# Patient Record
Sex: Male | Born: 1935 | Race: White | Hispanic: No | Smoking: Never smoker
Health system: Southern US, Community
[De-identification: ages and names within clinical notes are randomized; demographics above are authoritative.]

## PROBLEM LIST (undated history)

## (undated) DIAGNOSIS — I2510 Atherosclerotic heart disease of native coronary artery without angina pectoris: Secondary | ICD-10-CM

## (undated) DIAGNOSIS — M1990 Unspecified osteoarthritis, unspecified site: Secondary | ICD-10-CM

## (undated) DIAGNOSIS — F329 Major depressive disorder, single episode, unspecified: Secondary | ICD-10-CM

## (undated) DIAGNOSIS — R41841 Cognitive communication deficit: Secondary | ICD-10-CM

## (undated) DIAGNOSIS — E785 Hyperlipidemia, unspecified: Secondary | ICD-10-CM

## (undated) DIAGNOSIS — G4700 Insomnia, unspecified: Secondary | ICD-10-CM

## (undated) DIAGNOSIS — R1310 Dysphagia, unspecified: Secondary | ICD-10-CM

## (undated) DIAGNOSIS — F32A Depression, unspecified: Secondary | ICD-10-CM

## (undated) DIAGNOSIS — F0390 Unspecified dementia without behavioral disturbance: Secondary | ICD-10-CM

## (undated) DIAGNOSIS — R42 Dizziness and giddiness: Secondary | ICD-10-CM

## (undated) DIAGNOSIS — M25569 Pain in unspecified knee: Secondary | ICD-10-CM

## (undated) DIAGNOSIS — K5730 Diverticulosis of large intestine without perforation or abscess without bleeding: Secondary | ICD-10-CM

## (undated) DIAGNOSIS — E7800 Pure hypercholesterolemia, unspecified: Secondary | ICD-10-CM

## (undated) DIAGNOSIS — K5900 Constipation, unspecified: Secondary | ICD-10-CM

## (undated) DIAGNOSIS — I10 Essential (primary) hypertension: Secondary | ICD-10-CM

## (undated) DIAGNOSIS — N400 Enlarged prostate without lower urinary tract symptoms: Secondary | ICD-10-CM

## (undated) DIAGNOSIS — M6281 Muscle weakness (generalized): Secondary | ICD-10-CM

## (undated) DIAGNOSIS — K635 Polyp of colon: Secondary | ICD-10-CM

## (undated) DIAGNOSIS — J189 Pneumonia, unspecified organism: Secondary | ICD-10-CM

## (undated) DIAGNOSIS — I739 Peripheral vascular disease, unspecified: Secondary | ICD-10-CM

## (undated) HISTORY — PX: TONSILLECTOMY: SUR1361

## (undated) HISTORY — PX: HERNIA REPAIR: SHX51

## (undated) HISTORY — PX: APPENDECTOMY: SHX54

## (undated) HISTORY — DX: Polyp of colon: K63.5

## (undated) HISTORY — DX: Dizziness and giddiness: R42

## (undated) HISTORY — DX: Essential (primary) hypertension: I10

## (undated) HISTORY — DX: Hyperlipidemia, unspecified: E78.5

## (undated) HISTORY — PX: ENDARTERECTOMY FEMORAL: SHX5804

## (undated) HISTORY — DX: Diverticulosis of large intestine without perforation or abscess without bleeding: K57.30

## (undated) HISTORY — PX: ANGIOPLASTY: SHX39

## (undated) HISTORY — DX: Atherosclerotic heart disease of native coronary artery without angina pectoris: I25.10

---

## 2000-10-08 ENCOUNTER — Encounter (INDEPENDENT_AMBULATORY_CARE_PROVIDER_SITE_OTHER): Payer: Self-pay | Admitting: Specialist

## 2000-10-08 ENCOUNTER — Ambulatory Visit (HOSPITAL_COMMUNITY): Admission: RE | Admit: 2000-10-08 | Discharge: 2000-10-08 | Payer: Self-pay | Admitting: Gastroenterology

## 2002-04-03 ENCOUNTER — Encounter: Payer: Self-pay | Admitting: Internal Medicine

## 2002-04-03 ENCOUNTER — Inpatient Hospital Stay (HOSPITAL_COMMUNITY): Admission: EM | Admit: 2002-04-03 | Discharge: 2002-04-07 | Payer: Self-pay | Admitting: Internal Medicine

## 2007-04-10 ENCOUNTER — Encounter: Admission: RE | Admit: 2007-04-10 | Discharge: 2007-04-10 | Payer: Self-pay | Admitting: Cardiology

## 2007-04-14 ENCOUNTER — Inpatient Hospital Stay (HOSPITAL_COMMUNITY): Admission: AD | Admit: 2007-04-14 | Discharge: 2007-04-15 | Payer: Self-pay | Admitting: Cardiology

## 2007-04-14 ENCOUNTER — Encounter: Payer: Self-pay | Admitting: Cardiology

## 2007-04-14 ENCOUNTER — Ambulatory Visit: Payer: Self-pay | Admitting: Vascular Surgery

## 2007-07-25 ENCOUNTER — Encounter: Payer: Self-pay | Admitting: Cardiology

## 2007-07-25 ENCOUNTER — Ambulatory Visit: Payer: Self-pay | Admitting: Vascular Surgery

## 2007-08-18 ENCOUNTER — Encounter: Payer: Self-pay | Admitting: Cardiology

## 2007-08-18 ENCOUNTER — Encounter: Payer: Self-pay | Admitting: Vascular Surgery

## 2007-08-18 ENCOUNTER — Inpatient Hospital Stay (HOSPITAL_COMMUNITY): Admission: RE | Admit: 2007-08-18 | Discharge: 2007-08-19 | Payer: Self-pay | Admitting: Vascular Surgery

## 2007-08-18 ENCOUNTER — Ambulatory Visit: Payer: Self-pay | Admitting: Vascular Surgery

## 2007-09-12 ENCOUNTER — Ambulatory Visit: Payer: Self-pay | Admitting: Vascular Surgery

## 2007-09-12 ENCOUNTER — Encounter: Payer: Self-pay | Admitting: Cardiology

## 2008-12-07 IMAGING — CR DG CHEST 2V
2 series · 2 of 2 positions shown · non-contrast
Comparison: 04/10/2007.

CLINICAL DATA: 84-year-old male preoperative exam, peripheral
vascular disease.

CHEST - 2 VIEW

[view not recorded (1 of 2)]
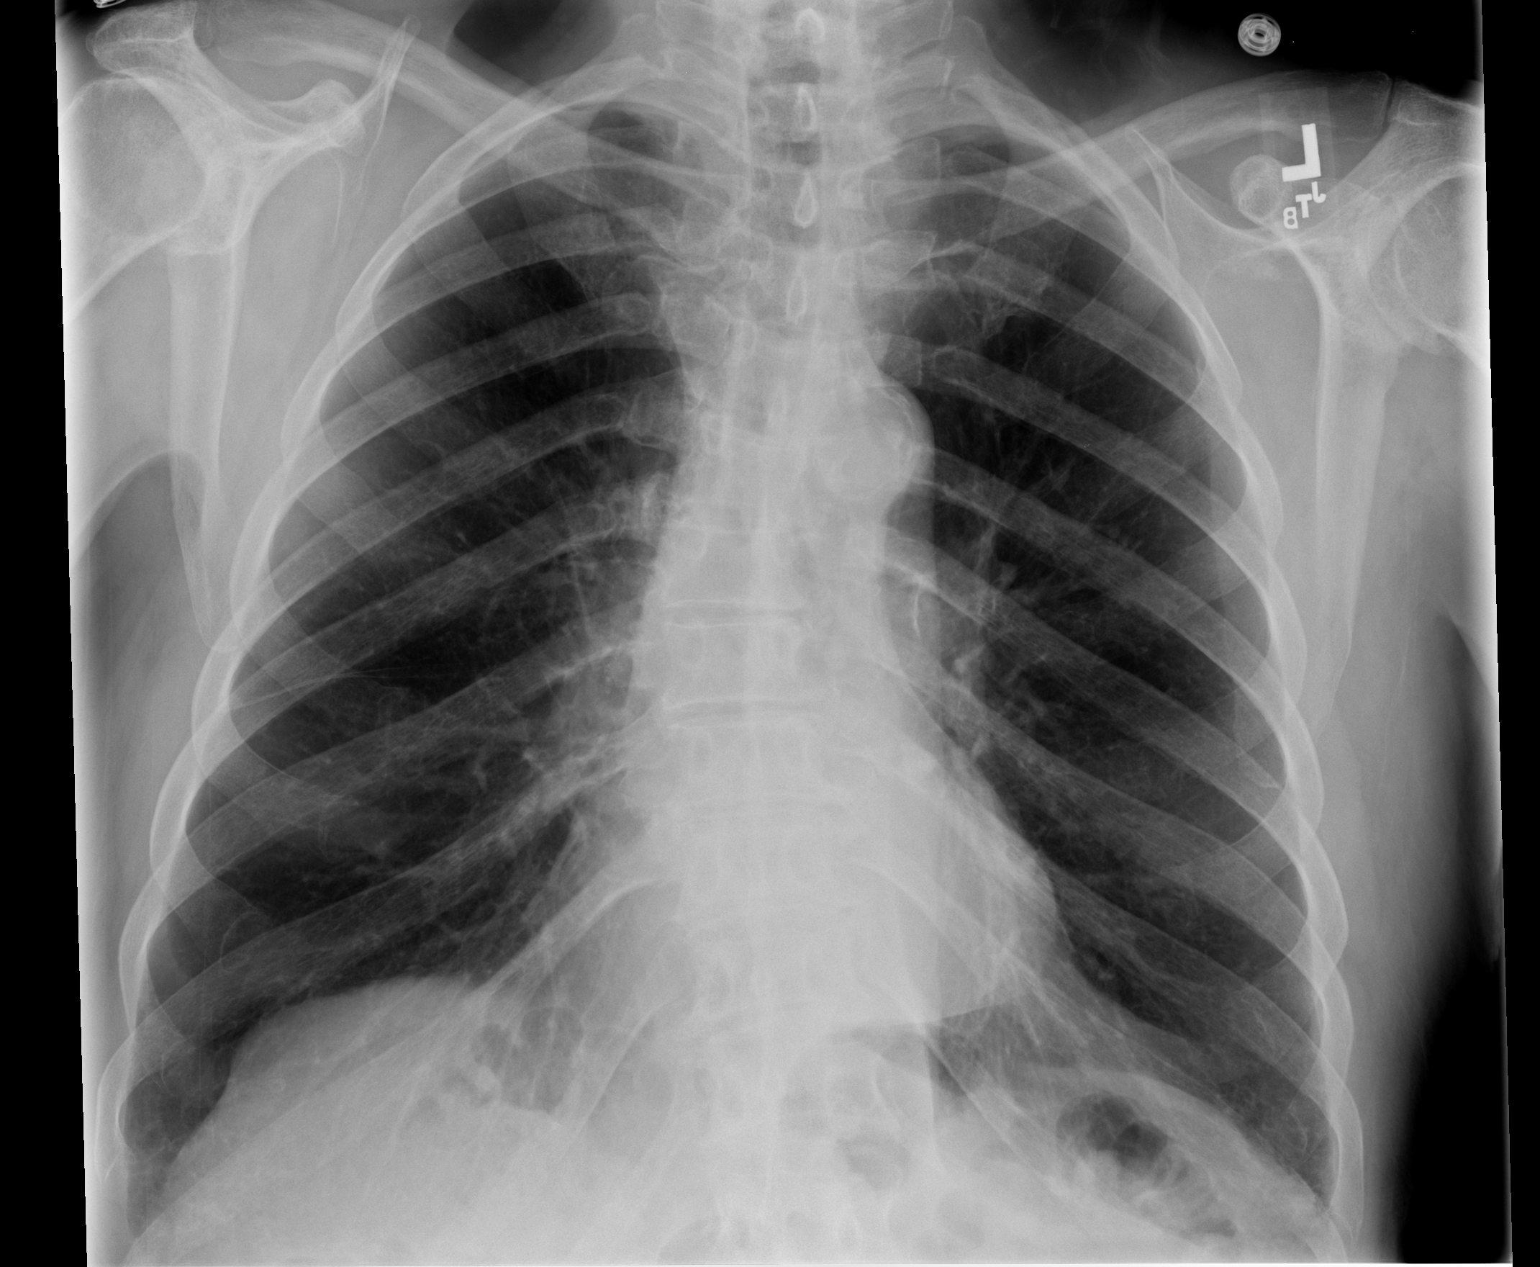

[view not recorded (2 of 2)]
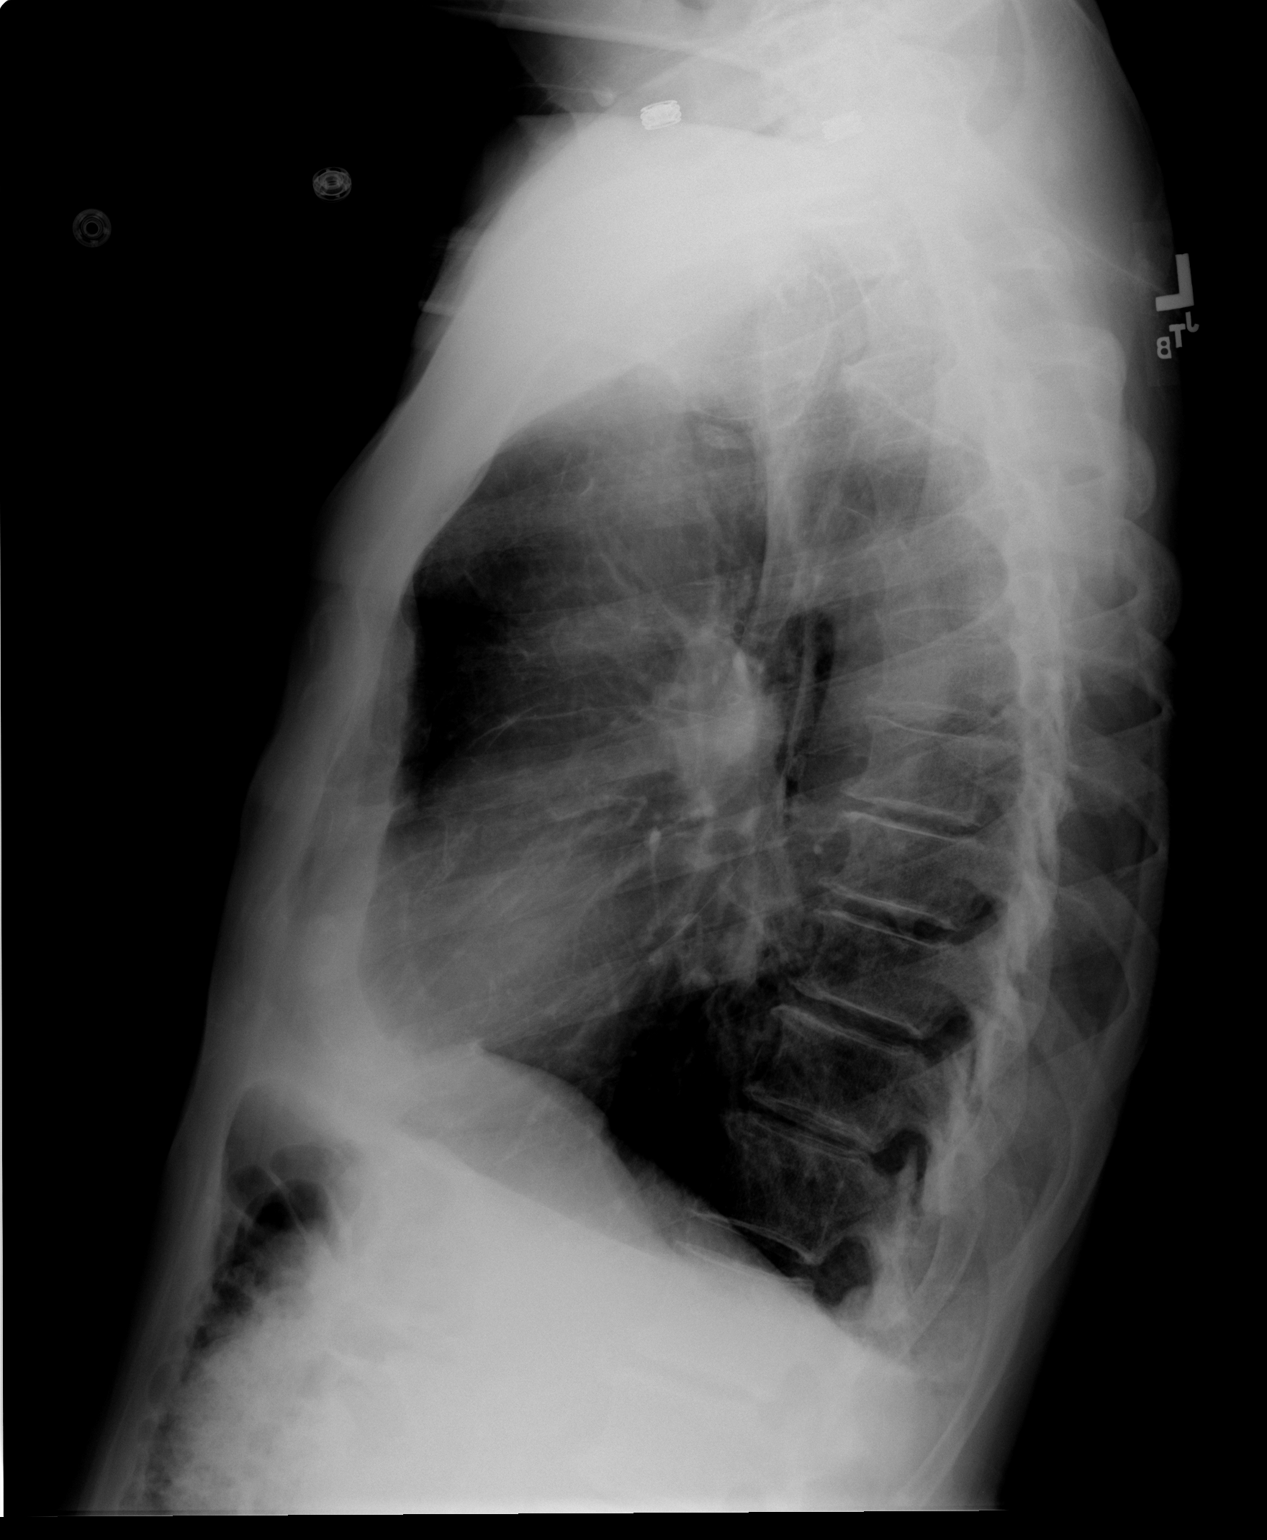

[2 of 2 positions shown; findings below may reference images not displayed]

FINDINGS: Cardiac size is stable and within normal limits.
Mediastinal contour is stable within normal limits.  Large lung
volumes are re-identified with mild basilar and perihilar linear
markings which probably reflects scarring.  No pneumothorax,
pulmonary edema, pleural effusion or acute airspace opacity.
Stable visualized osseous structures.
IMPRESSION: No acute cardiopulmonary abnormality.

## 2009-11-14 ENCOUNTER — Ambulatory Visit: Payer: Self-pay | Admitting: Cardiology

## 2009-11-14 DIAGNOSIS — I739 Peripheral vascular disease, unspecified: Secondary | ICD-10-CM

## 2009-11-14 DIAGNOSIS — I2510 Atherosclerotic heart disease of native coronary artery without angina pectoris: Secondary | ICD-10-CM | POA: Insufficient documentation

## 2009-11-14 DIAGNOSIS — I10 Essential (primary) hypertension: Secondary | ICD-10-CM

## 2009-11-14 DIAGNOSIS — E7800 Pure hypercholesterolemia, unspecified: Secondary | ICD-10-CM

## 2009-11-18 ENCOUNTER — Encounter: Payer: Self-pay | Admitting: Cardiology

## 2009-11-28 ENCOUNTER — Telehealth: Payer: Self-pay | Admitting: Cardiology

## 2009-11-29 ENCOUNTER — Ambulatory Visit: Payer: Self-pay

## 2009-11-29 ENCOUNTER — Encounter: Payer: Self-pay | Admitting: Cardiology

## 2009-12-08 ENCOUNTER — Telehealth: Payer: Self-pay | Admitting: Cardiology

## 2009-12-16 ENCOUNTER — Ambulatory Visit: Payer: Self-pay

## 2009-12-19 ENCOUNTER — Ambulatory Visit (HOSPITAL_COMMUNITY): Admission: RE | Admit: 2009-12-19 | Discharge: 2009-12-19 | Payer: Self-pay | Admitting: Obstetrics and Gynecology

## 2009-12-19 ENCOUNTER — Ambulatory Visit: Payer: Self-pay | Admitting: Cardiology

## 2009-12-19 ENCOUNTER — Encounter: Payer: Self-pay | Admitting: Cardiology

## 2010-04-04 NOTE — Assessment & Plan Note (Signed)
Summary: np6/cardiac eval/jml   Primary Provider:  Dr. Christell Constant  CC:  new patient.  cardiac evaluation. Pt states he feels good.  He is having no complications.  .  History of Present Illness: 77 yo with history of CAD and PAD presents to establish cardiology care.  He had NSTEMI in 2004 with Cypher DES to RCA, and he had right common femoral arterectomy by Dr. Arbie Cookey in 2009.  He has been doing well over the past couple of years.  He has not had any exertional chest pain.  He does not get short of breath with exertion, either.  He is quite active.  He walks and push mows his yard.  BP is elevated today at 176/84.  He should be taking lisinopril 40 mg daily but only remembers about twice a week.  Last time he took it was Saturday.  He lives at home by himself (widower).  He denies any claudication-type symptoms.    ECG: NSR, normal  Current Medications (verified): 1)  Plavix 75 Mg Tabs (Clopidogrel Bisulfate) .... One Tablet Once Daily 2)  Aspirin 81 Mg Tabs (Aspirin) .... Two Tablets Every Morning 3)  Lisinopril 40 Mg Tabs (Lisinopril) .... Take One Tablet Twice A Week For Hbp.  Allergies (verified): 1)  ! Pcn  Past History:  Past Medical History: 1. CAD: NSTEMI 2/04 with occluded RCA.  Cypher DES to RCA.  EF 45-50% on LV-gram.  2. PAD: Right CFA endarterectomy (Early) in 6/09.  3. HTN 4. Hyperlipidemia  Family History: No premature CAD.   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  He quit smoking 35 years ago Does not drink alcohol 2 sons Lives in [place].     Review of Systems       All systems reviewed and negative except as per HPI.   Vital Signs:  Patient profile:   77 year old male Height:      67 inches Weight:      133 pounds BMI:     20.91 Pulse rate:   92 / minute Pulse rhythm:   regular BP sitting:   176 / 84  (left arm) Cuff size:   regular  Vitals Entered By: Judithe Modest CMA (November 14, 2009  9:21 AM)  Physical Exam  General:  Well developed, well nourished, in no acute distress. Head:  normocephalic and atraumatic Nose:  no deformity, discharge, inflammation, or lesions Mouth:  Teeth, gums and palate normal. Oral mucosa normal. Neck:  Neck supple, no JVD. No masses, thyromegaly or abnormal cervical nodes. Lungs:  Clear bilaterally to auscultation and percussion. Heart:  Non-displaced PMI, chest non-tender; regular rate and rhythm, S1, S2 without murmurs, rubs. +S4. Carotid upstroke normal, no bruit. 1+ PT bilaterally. No edema, no varicosities. Abdomen:  Bowel sounds positive; abdomen soft and non-tender without masses, organomegaly, or hernias noted. No hepatosplenomegaly. Msk:  Back normal, normal gait. Muscle strength and tone normal. Extremities:  No clubbing or cyanosis. Neurologic:  Alert and oriented x 3. Skin:  Intact without lesions or rashes. Psych:  Normal affect.   Impression & Recommendations:  Problem # 1:  CORONARY ATHEROSCLEROSIS NATIVE CORONARY ARTERY (ICD-414.01) No ischemic symptoms.  He is not taking a statin and really ought to be.  I am going to start him on simvastatin.  I will get lipids/LFTs this week to decide on dosing and repeat lipids/LFTs in 2 months with goal LDL < 70.  He needs to continue ASA  and Plavix.  Given mildly decreased LV systolic function on LV-gram in 2004, I will set him up for an echo to assess LV systolic function.   Problem # 2:  UNSPECIFIED ESSENTIAL HYPERTENSION (ICD-401.9) BP is high but patient rarely takes his lisinopril.  I asked him to take it every day.  We will call him in a couple of weeks for a BP check on medication (he will get him BP checked at the pharmacy).    Problem # 3:  PVD (ICD-443.9) No claudication.  Will get lower extremity arterial dopplers to followup on PAD.   Other Orders: Echocardiogram (Echo) Arterial Duplex Lower Extremity (Arterial Duplex Low)  Patient Instructions: 1)  Your physician has  recommended you make the following change in your medication:  2)  Take Lisinopril 40mg  daily 3)  Start Simvastatin 20mg   in the evening--this is for your cholesterol. 4)  Take and record your blood pressure--I will call you in 2 weeks to get the readings. Luana Shu [phone]  5)  Your physician has requested that you have a lower extremity arterial duplex.  This test is an ultrasound of the arteries in the legs.  It looks at arterial blood flow in the legs and arms.  Allow one hour for Lower Arterial scans. There are no restrictions or special instructions. 6)  Your physician has requested that you have an echocardiogram.  Echocardiography is a painless test that uses sound waves to create images of your heart. It provides your doctor with information about the size and shape of your heart and how well your heart's chambers and valves are working.  This procedure takes approximately one hour. There are no restrictions for this procedure. 7)  Fasting lab this week--Lipid profile/Liver profile/BMP this week--you have the order.--You can have this at Dr [facility] office. 8)  Your physician recommends that you return for a FASTING lipid profile/liver profile in 2 months--you have the order. You can have this at [facility] office. 9)  Your physician wants you to follow-up in:  6 months with Dr Shirlee Latch. You will receive a reminder letter in the mail two months in advance. If you don't receive a letter, please call our office to schedule the follow-up appointment. Prescriptions: SIMVASTATIN 20 MG TABS (SIMVASTATIN) one in the evening  #30 x 4   Entered by:   Katina Dung, RN, BSN   Authorized by:   Marca Ancona, MD   Signed by:   Katina Dung, RN, BSN on 11/14/2009   Method used:   Faxed to ...       [facility] (retail)       [address]       [place], [place]  [address]       Ph: [phone] or [phone]       Fax: [phone]   RxID:    [id number] LISINOPRIL 40 MG TABS (LISINOPRIL) take one tablet daily  #30 x 11   Entered by:   Katina Dung, RN, BSN   Authorized by:   Marca Ancona, MD   Signed by:   Katina Dung, RN, BSN on 11/14/2009   Method used:   Faxed to ...       Hospital doctor (retail)       [address]       [place], [place]  [address]       Ph:  [phone] or [phone]       Fax: [phone]   RxID:   [id number]

## 2010-04-04 NOTE — Letter (Signed)
[facility]   Imported By: Roderic Ovens 11/28/2009 10:53:30  _____________________________________________________________________  External Attachment:    Type:   Image     Comment:   External Document

## 2010-04-04 NOTE — Op Note (Signed)
[facility]   Imported By: Roderic Ovens 11/28/2009 10:56:31  _____________________________________________________________________  External Attachment:    Type:   Image     Comment:   External Document

## 2010-04-04 NOTE — Consult Note (Signed)
[facility]   Imported By: Roderic Ovens 11/28/2009 10:59:56  _____________________________________________________________________  External Attachment:    Type:   Image     Comment:   External Document

## 2010-04-04 NOTE — Letter (Signed)
Summary: Generic Letter  Architectural technologist, Main Office  [address]   [place], [place] [address]   Phone: [phone]  Fax: [phone]    11/14/2009  Frank Fleming [address] [place], [place]  [address] DOB June 15, 1923    Fasting Lipid Profile/Liver profile in 2 months--414.01 272.0 401.9 Please fax results to [phone] Dr Jen Mow [name],MD

## 2010-04-04 NOTE — Progress Notes (Signed)
Summary: B/P readings--11/28/09   Phone Note Outgoing Call   Call placed by: Katina Dung, RN, BSN,  November 28, 2009 9:01 AM Call placed to: Patient Summary of Call: call and gt B/P readings  Follow-up for Phone Call        11/14/2009 take Lisinopril 40mg  daily regularly--call and get B/P readings --[facility] Katina Dung, RN, BSN  November 28, 2009 9:46 AM ---I talked with pt-he states he has not been checking his B/P but has been going to Dr [facility] office and getting his B/P checked there-he has not keep a record of the readings--he states Dr Christell Constant had given him a new prescription for his B/P recently because it had been elevated --He went for B/P check yesterday  at Dr Kathi Der and was told his B/P was alot better and that it looked good--I will forward to Dr Shirlee Latch for review

## 2010-04-04 NOTE — Letter (Signed)
Summary: [facility] Office Visit Note   [facility] Office Visit Note   Imported By: Roderic Ovens 11/28/2009 11:03:59  _____________________________________________________________________  External Attachment:    Type:   Image     Comment:   External Document

## 2010-04-04 NOTE — Progress Notes (Signed)
Summary: TEST RESULTS  Phone Note Call from Patient Call back at Home Phone [phone]   Caller: Patient Reason for Call: Talk to Nurse, Lab or Test Results Initial call taken by: Lorne Skeens,  December 08, 2009 10:04 AM  Follow-up for Phone Call        [facility] Katina Dung, RN, BSN  December 08, 2009 11:10 AM --talked with pt --echo rescheduled tp 12/19/09

## 2010-04-04 NOTE — Letter (Signed)
Summary: Generic Letter  Architectural technologist, Main Office  [address]   [place], [place] [address]   Phone: [phone]  Fax: [phone]    11/14/2009  Frank Fleming [address] [place], [place]  [address] DOB 1923-06-07   Fasting lipid profile/liver profile/BMP  414.01 401.9 272.0  This week Please fax results to [phone] Dr Juluis Rainier, MD

## 2010-05-15 ENCOUNTER — Encounter: Payer: Self-pay | Admitting: Cardiology

## 2010-05-15 ENCOUNTER — Ambulatory Visit (INDEPENDENT_AMBULATORY_CARE_PROVIDER_SITE_OTHER): Payer: Medicare Other | Admitting: Cardiology

## 2010-05-15 DIAGNOSIS — I2510 Atherosclerotic heart disease of native coronary artery without angina pectoris: Secondary | ICD-10-CM

## 2010-05-15 DIAGNOSIS — I10 Essential (primary) hypertension: Secondary | ICD-10-CM

## 2010-05-23 NOTE — Assessment & Plan Note (Signed)
Summary: F6M PER LAST VISIT IN SEPT/LG   Primary Provider:  Dr. Christell Constant  CC:  rov.  Marland Kitchen  History of Present Illness: 77 yo with history of CAD and PAD presents for cardiology followup.  He had NSTEMI in 2004 with Cypher DES to RCA, and he had right common femoral arterectomy by Dr. Arbie Cookey in 2009.  Recent echo showed preserved LF systolic function.   He has been doing well over the past couple of years.  He has not had any exertional chest pain.  He does not get short of breath with exertion, either.  He is quite active.  He walks and push mows his yard.  He walks about a mile a day. BP is better controlled with current regimen.  He denies any claudication-type symptoms.    ECG: NSR, normal  Labs (9/11): LDL-P 1347, LDL 94, HDL 60, K 4.9, creatinine 1.61  Current Medications (verified): 1)  Plavix 75 Mg Tabs (Clopidogrel Bisulfate) .... One Tablet Once Daily 2)  Aspirin 81 Mg Tabs (Aspirin) .... Two Tablets Every Morning 3)  Lisinopril-Hydrochlorothiazide 20-12.5 Mg Tabs (Lisinopril-Hydrochlorothiazide) .... Two Daily 4)  Norvasc 5 Mg Tabs (Amlodipine Besylate) .... One Daily  Allergies (verified): 1)  ! Pcn  Past History:  Past Medical History: 1. CAD: NSTEMI 2/04 with occluded RCA.  Cypher DES to RCA.  EF 45-50% on LV-gram.  Echo (10/11) with EF 65-70%, grade I diastolic dysfunction, no significant valvular disease.  2. PAD: Right CFA endarterectomy (Early) in 6/09.  ABIs (10/11) showed mild disease with 0.82 on right and 0.83 on left.  3. HTN 4. Hyperlipidemia  Family History: Reviewed history from 11/14/2009 and no changes required. No premature CAD.   Social History: Reviewed history from 11/14/2009 and no changes required. 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  He quit smoking 35 years ago Does not drink alcohol 2 sons Lives in [place].     Review of Systems       All systems reviewed and negative except as per HPI.   Vital  Signs:  Patient profile:   77 year old male Height:      67 inches Weight:      133 pounds BMI:     20.91 Pulse rate:   76 / minute Pulse rhythm:   regular BP sitting:   136 / 66  (left arm) Cuff size:   regular  Vitals Entered By: Judithe Modest CMA (May 15, 2010 3:46 PM)  Physical Exam  General:  Well developed, well nourished, in no acute distress. Neck:  Neck supple, no JVD. No masses, thyromegaly or abnormal cervical nodes. Lungs:  Clear bilaterally to auscultation and percussion. Heart:  Non-displaced PMI, chest non-tender; regular rate and rhythm, S1, S2 without murmurs, rubs. +S4. Carotid upstroke normal, no bruit. 1+ PT bilaterally. No edema, no varicosities. Abdomen:  Bowel sounds positive; abdomen soft and non-tender without masses, organomegaly, or hernias noted. No hepatosplenomegaly. Extremities:  No clubbing or cyanosis. Neurologic:  Alert and oriented x 3. Psych:  Normal affect.   Impression & Recommendations:  Problem # 1:  CORONARY ATHEROSCLEROSIS NATIVE CORONARY ARTERY (ICD-414.01) No ischemic symptoms.  EF preserved on echo.  Continue ASA, Plavix, ACEI.   As below, will add statin.   Problem # 2:  HYPERCHOLESTEROLEMIA (ICD-272.0) Goal LDL < 70, start simvastatin 20 mg daily.   Lipids/LFTs in 2 months.   Problem # 3:  UNSPECIFIED ESSENTIAL HYPERTENSION (ICD-401.9) BP under better control. Continue  current meds.   Problem # 4:  PVD (ICD-443.9) Mild PAD by ABIs.   Patient Instructions: 1)  Your physician has recommended you make the following change in your medication:  2)  Start Simvastatin 20mg  daily in the evening.  3)  Fasting Lipid/liver profile in 2 months  401.9  414.01--you have the order--please fax the results to [phone] 4)  Your physician wants you to follow-up in:1year with Dr Shirlee Latch.[facility] 2013)   You will receive a reminder letter in the mail two months in advance. If you don't receive a letter, please call our office to schedule the  follow-up appointment. Prescriptions: SIMVASTATIN 20 MG TABS (SIMVASTATIN) one daily in the evening  #30 x 6   Entered by:   Katina Dung, RN, BSN   Authorized by:   Marca Ancona, MD   Signed by:   Katina Dung, RN, BSN on 05/15/2010   Method used:   Print then Give to Patient   RxID:   [id number]

## 2010-07-18 NOTE — Cardiovascular Report (Signed)
Frank Fleming, Frank Fleming              ACCOUNT NO.:  [id number]   MEDICAL RECORD NO.:  [id number]          PATIENT TYPE:  AMB   LOCATION:  DFTL                         FACILITY:  [facility]   PHYSICIAN:  Vonna Kotyk R. Jacinto Halim, MD       DATE OF BIRTH:  [birth year]   DATE OF PROCEDURE:  04/14/2007  DATE OF DISCHARGE:                            CARDIAC CATHETERIZATION   PROCEDURES PERFORMED:  1. Abdominal aortogram.  2. Pelvic arteriogram.  3. Abdominal aortogram with bifemoral runoff.  4. Right femoral arteriogram.   INDICATIONS:  Frank Fleming is an 83-year gentleman with history of  known coronary artery disease and has undergone angioplasty and stenting  to the occluded right coronary artery when he presented with unstable  angina and non-Q-wave myocardial infarction in 2004.  He had known  peripheral arterial disease with a known high-grade stenosis of the  right common femoral artery by Dopplers and was being followed  medically.  He has had increasing claudication, but now he was amenable  for angiography, and he was brought to the catheterization lab to  evaluate his peripheral anatomy.  His ABI on the right was 0.82 on the  left was 0.97.  He had common femoral artery velocity of 621 cm/sec.   ANGIOGRAPHIC DATA:  Abdominal aortogram revealed presence of two renal  arteries on either side.  They are widely patent.  The abdominal aorta  showed diffuse luminal irregularity and moderate to severe amount of  calcification of the abdominal aorta, giving a porcine aorta.  The  distal abdominal aorta had mild atherosclerotic changes without any  significant calcium.  There was acute angle takeoff of the iliac  arteries.   Abdominal aortogram with bifemoral runoff.  Abdominal aortogram with  bifemoral runoff revealed a right external iliac arteries to have a 30-  40% calcific stenosis.  There was a high-grade focal 95% stenosis of the  right common femoral artery.  There was diffuse disease of  the mild  diffuse distal right superficial femoral artery followed by 30% stenosis  of the right popliteal artery.   Below the right knee, there was one-vessel runoff in the form of  peroneal artery.  The anterior and post tibial artery were occluded.   On the left lower extremity, the iliac arteries were patent and the left  common femoral artery showed a 40% stenosis.  Midsegment of the left SFA  showed a 70-80% stenosis.  Followed by this, the left popliteal artery  showed a 30% stenosis.   Below the left knee, there was two-vessel runoff noted in the form of  posterior tibial and peroneal arteries.  The anterior tibial artery was  occluded in the mid segment.   RECOMMENDATIONS:  Based on the angiographic data, the patient is not  amenable for a percutaneous revascularization given the fact that the  stenosis is at this hip joint.  I discussed the findings with Dr. Tawanna Cooler  Early who will be operating him in the form of right common femoral  endarterectomy as an outpatient procedure.  The patient admitted to the  hospital for overnight observation and  will be discharged home in the  morning.   Total of 130 mL of contrast was utilized for diagnostic angiography.   TECHNIQUE OF THE PROCEDURE:  Under usual sterile precautions using a 5-  French left femoral arterial access, a 5-French pigtail, the catheter  was advanced to abdominal aortogram and abdominal aortogram was  performed.  The same catheter was utilized to perform abdominal  aortogram with bifemoral runoff.  Using the same catheter to cross over  into the right common iliac artery and using a guidewire for the cross  over, a 5-French end-hole catheter was advanced to the right common  iliac artery and right common iliac arteriogram where visualization of  the femoral artery was performed.  Then the catheter was pulled out of  body.  The patient of procedure.  No immediate complication noted.      Cristy Hilts. Jacinto Halim, MD   Electronically Signed     JRG/MEDQ  D:  04/14/2007  T:  04/15/2007  Job:  [id number]   cc:   [name], M.D.  Ernestina Penna, M.D.

## 2010-07-18 NOTE — Op Note (Signed)
Frank Fleming, BRAILSFORD NO.:  [id number]   MEDICAL RECORD NO.:  [id number]          PATIENT TYPE:  INP   LOCATION:  2007                         FACILITY:  [facility]   PHYSICIAN:  [name], M.D.    DATE OF BIRTH:  1924/03/05   DATE OF PROCEDURE:  DATE OF DISCHARGE:                               OPERATIVE REPORT   PREOPERATIVE DIAGNOSIS:  Right leg claudication.   POSTOPERATIVE DIAGNOSIS:  Right leg claudication.   PROCEDURE:  Right femoral endarterectomy and Dacron patch angioplasty.   SURGEON:  [name], MD   ASSISTANT:  Nurse.   ANESTHESIA:  General endotracheal.   COMPLICATIONS:  None.   DISPOSITION:  To recovery room, stable.   PROCEDURE IN DETAIL:  The patient was taken to the operating room and  placed in supine position.  The area of the right groin was prepped and  draped in usual sterile fashion.  Incision was made over the femoral  pulse, carried down to the isolated common, superficial femoral and  profunda femoris arteries.  These were all encircled with vessel loops  for control.  Tributary branches were occluded with a 2-0 silk Potts  ties.  The patient was given 7000 units of intravenous heparin.  After  adequate circulation time, the distal sternal iliac artery was occluded  under the inguinal ligament with a Henley clamp.  The profunda femoris  artery was occluded with a vascular clamp and the superficial femoral  artery was occluded with a vessel loop.  The common femoral artery was  opened above the level of the plaque and was extended down onto the  proximal portion of the superficial femoral artery.  The patient did  have a tight stenosis at the common femoral artery.  The endarterectomy  was  begun on the common femoral artery and the plaque was divided  proximally with Potts scissors.  Dissection was turned down and the  profunda femoris artery was endarterectomized with eversion technique  with a good feathering end point.   The superficial femoral artery plaque  was divided with Potts scissors.  The distal superficial femoral artery  plaque was tacked with interrupted 6-0 Prolene sutures.  A finesse  Hemashield Dacron patch was brought onto the field and sewn as a patch  angioplasty with a running 6-0 Prolene suture.  Prior to completion of  the anastomosis, the usual flushing maneuvers were undertaken and the  anastomosis was then completed.  Flow was restored first to the profunda  and then the superficial femoral artery.  Excellent flow characteristics  were noted with hand-held Dopplers in the profunda femoris artery and  superficial femoral artery.  The patient was given 50 mg of protamine to  reverse heparin.  The  wounds were irrigated with saline.  Hemostasis was obtained with  electrocautery.  Wounds were closed with several layers of 2-0 Vicryl in  the subcutaneous tissue.  The skin was closed with 3-0 subcuticular  Vicryl stitch.  Sterile dressing was applied and the patient was taken  to the recovery room in stable condition.      Tawanna Cooler  Shirline Frees, M.D.  Electronically Signed     TFE/MEDQ  D:  08/18/2007  T:  08/19/2007  Job:  [id number]   cc:   Cristy Hilts. Jacinto Halim, MD  Gaynelle Cage, MD

## 2010-07-18 NOTE — Assessment & Plan Note (Signed)
OFFICE VISIT   Frank, Fleming  DOB:  Mar 20, 1923                                       09/12/2007  ZOXWR#:[id number]   Patient presents today for followup of his right femoral endarterectomy  and Dacron patch angioplasty on 08/18/07.  He did well and was  discharged home on postoperative day #1.  He reports that he is walking  without claudication symptoms.  He has mild incisional soreness in his  right groin, and this is recovering.   On physical exam, his femoral pulse is 3+.  His wound is well healed.  He has 2+ popliteal pulses bilaterally.  Ankle-arm index is stable at  0.83 on the right and 0.90 on the left.   I am quite pleased with his initial result, as if the patient.  He will  see [place] again on an as-needed basis.   [name], M.D.  Electronically Signed   TFE/MEDQ  D:  09/12/2007  T:  09/12/2007  Job:  [id number]   cc:   Ernestina Penna, M.D.  Cristy Hilts. Jacinto Halim, MD

## 2010-07-18 NOTE — Consult Note (Signed)
Frank Fleming              ACCOUNT NO.:  [id number]   MEDICAL RECORD NO.:  [id number]          PATIENT TYPE:  INP   LOCATION:  6533                         FACILITY:  [facility]   PHYSICIAN:  [name], M.D.    DATE OF BIRTH:  October 21, 1923   DATE OF CONSULTATION:  04/14/2007  DATE OF DISCHARGE:                                 CONSULTATION   REFERRING PHYSICIAN:  Vonna Kotyk R. Jacinto Halim, MD   CHIEF COMPLAINT:  Right leg claudication.   HISTORY OF PRESENT ILLNESS:  Frank Fleming is a pleasant, active 77-  year-old white male who underwent outpatient arteriography today for  evaluation of right leg claudication.  He is very active and exercises  several hours in the morning and in the afternoon.  He used to walk 4-5  miles per day but had to stop this due to limiting right leg  claudication.  He reports that he can walk approximately one block and  has to stop and rest.  He reports that his whole right leg becomes heavy  and is then eased with rest.  He does not have any tissue loss.  His  denies any stroke, current coronary disease, specifically any chest  pain, shortness of breath, dyspnea on exertion.   PAST MEDICAL HISTORY:  Significant for prior right coronary artery  stenting in 2004.  He has not had a myocardial infarction.   REVIEW OF SYSTEMS:  Negative aside from leg pain with walking.   SOCIAL HISTORY:  He is widowed.  He has two children.  He does not  smoke.  Positive social alcohol consumption occasionally.   MEDICATIONS:  1. Metoprolol 50 mg p.o. daily.  2. Lisinopril 20 mg p.o. daily.  3. Vytorin 10/20 mg daily.  4. Aspirin 81 mg daily.   PHYSICAL EXAMINATION:  GENERAL:  Well-developed, well-nourished white  male appearing stated age of 50.  CHEST:  Clear bilaterally.  HEART:  Regular rate and rhythm.  ABDOMEN:  Benign.  EXTREMITIES:  He has 2+ radial pulses.  He has 1+ right femoral and 2+  left femoral pulse.  He does have palpable right and left popliteal  pulses, and I do not palpate pedal pulses bilaterally.   X-RAY DATA:  He did undergo arteriography today by Dr. Jacinto Halim, and I have  reviewed this with Dr. Jacinto Halim and the patient.  He has a severe high-  grade stenosis in its common femoral artery just above the femoral  artery bifurcation. He has single-vessel peroneal runoff on the right,  and on the left he has moderate superficial femoral artery occlusive  disease but no critical stenosis.   IMPRESSION:  Limiting claudication, right leg.   PLAN:  I discussed options with Frank Fleming.  I explained that this is  currently not limb threatening and in all likelihood would never  progress to this level in his lifetime.  I did explain the option of  right common femoral endarterectomy for relief of claudication symptoms.  I explained that this would be a local incision in his groin under  general anesthesia with endarterectomy.  I explained that this  would be  an overnight stay unless there were complications which I did not  suspect. He understands the procedure risks and limitations and is  willing to proceed with surgery when he can be scheduled for overnight  stay and operation.      [name], M.D.  Electronically Signed     TFE/MEDQ  D:  04/14/2007  T:  04/16/2007  Job:  [id number]   cc:   Cristy Hilts. Jacinto Halim, MD

## 2010-07-18 NOTE — Discharge Summary (Signed)
NAMEMALAKY, TETRAULT NO.:  [id number]   MEDICAL RECORD NO.:  [id number]          PATIENT TYPE:  INP   LOCATION:  2007                         FACILITY:  [facility]   PHYSICIAN:  [name], M.D.    DATE OF BIRTH:  1923-08-26   DATE OF ADMISSION:  08/18/2007  DATE OF DISCHARGE:  08/19/2007                               DISCHARGE SUMMARY   DIAGNOSES:  1. Right leg claudication.  2. Hypertension.  3. Elevated cholesterol.   PROCEDURES PERFORMED:  Right femoral endarterectomy by Dr. Arbie Cookey on August 18, 2007.   COMPLICATIONS:  None.   DISCHARGE MEDICATIONS:  1. Lisinopril 40 mg p.o. daily.  2. Crestor  20 mg p.o. daily.  3. Plavix 75 mg p.o. daily.  4. Coreg 12.5 mg p.o. daily.  5. Percocet 5/325 one p.o. q.4 h. p.r.n. pain total #20 were given.   DISPOSITION:  He has been discharged to home in stable condition with  his wound healing well.  He is instructed to keep the wound clean with  soap and warm water.  He is to keep a dry gauze and bandage in his  groin.  He is to observe the wound for drainage, increasing swelling,  redness, pain, or fever greater than 101.2.  He is to see Dr. Arbie Cookey in 2-  3 weeks with ABIs.  Our office will call with a visit.   BRIEF IDENTIFYING STATEMENT:  For complete details, please refer to the  typed history and physical.   Briefly, this very active 77 year old gentleman was referred to Dr.  Arbie Cookey with leg claudication on the right.  He was found to have a high-  grade narrowing of his right common femoral artery.  Dr. Arbie Cookey  recommended femoral endarterectomy.  After a careful consideration, Mr.  Ramroop elected to proceed with surgery.   HOSPITAL COURSE:  Preoperative workup was completed as an outpatient.  He was brought in through same-day surgery and underwent the  aforementioned right carotid endarterectomy.  For complete details,  please refer to the typed operative report.  The procedure was without  complications.  He  was returned to the post-anesthesia care unit,  extubated.  Following stabilization, he was transferred to a bed on a  surgical convalescent floor.  He was observed  overnight.  Following morning, he was mobile.  He was felt stable and  was desired to discharge.  He was subsequently discharged home.   CONDITION ON DISCHARGE:  Stable.      Wilmon Arms, PA      [name], M.D.  Electronically Signed    KEL/MEDQ  D:  08/19/2007  T:  08/19/2007  Job:  [id number]

## 2010-07-18 NOTE — H&P (Signed)
HISTORY AND PHYSICAL EXAMINATION   Jul 25, 2007   Re:  Frank Fleming, Frank Fleming              DOB:  [birth year]   CHIEF COMPLAINT:  Right leg claudication.   HISTORY OF PRESENT ILLNESS:  The patient is a very active 77 year old  gentleman with progressive severe total leg claudication on the right.  He is otherwise quite active with no major medical difficulties.  He  reports this is limiting his ability to exercise and with his activities  of daily living.  He had undergone arteriography by Dr. Yates Decamp at  [facility] in February.  This revealed a high grade stenosis of  his common femoral artery and right above the femoral bifurcation.  The  patient did have patent profunda and superficial femoral arteries.  He  denies any difficulty in the left leg.  This also denies chest pain,  shortness of breath.   REVIEW OF SYSTEMS:  He has no cardiac, pulmonary, GI dysfunction.  Has  no GU dysfunction.  His does have pain in his right leg with walking.  He is otherwise neurologically intact.   PAST MEDICAL HISTORY:  Includes hypertension, elevated cholesterol.   FAMILY HISTORY:  Negative for premature atherosclerotic disease.   SOCIAL HISTORY:  He is retired.  He quit smoking 35 years ago.  Does not  drink alcohol.   MEDICATION ALLERGIES:  To penicillin.   CURRENT MEDICATIONS:  Lisinopril 40 mg at bedtime, Crestor or 20 mg at  bedtime, Plavix 75 mg once daily.  Carvedilol 12.5 mg daily.   PHYSICAL EXAM:  Well-developed, well-nourished white male appearing  younger than stated age of 57.  Blood pressure 138/71, pulse 63,  respirations 18.  His carotid arteries are without bruits bilaterally.  He has 2+ radial pulses, 2+ femoral pulses.  He does have absent distal  pulses bilaterally.   IMPRESSION:  High-grade stenosis in the right common femoral artery with  limiting claudication.   PLAN:  The patient to be admitted for a right femoral endarterectomy and  patch angioplasty.  Discussed the procedure and expected 1-day  hospitalization.  He understands and wishes to proceed.  We will  schedule this at his convenience on 08/18/2007 at [facility].   [name], M.D.  Electronically Signed   TFE/MEDQ  D:  07/25/2007  T:  07/29/2007  Job:  [id number]   cc:   Cristy Hilts. Jacinto Halim, MD  Gaynelle Cage, MD

## 2010-07-21 NOTE — Cardiovascular Report (Signed)
NAME:  Frank Fleming, Frank Fleming                        ACCOUNT NO.:  [id number]   MEDICAL RECORD NO.:  [id number]                   PATIENT TYPE:  INP   LOCATION:  6533                                 FACILITY:  [facility]   PHYSICIAN:  Cristy Hilts. Jacinto Halim, M.D.                  DATE OF BIRTH:  January 31, 1924   DATE OF PROCEDURE:  04/06/2002  DATE OF DISCHARGE:                              CARDIAC CATHETERIZATION   REFERRING PHYSICIAN:  Gaynelle Cage, M.D.   PROCEDURES PERFORMED:  1. Left ventriculography.  2. Selective right and left coronary arteriography.  3. Thrombectomy of occluded right coronary artery with the Export catheter.  4. Percutaneous transluminal coronary angioplasty and stenting of the mid     right coronary artery.   INDICATION:  The patient is a 77 year old gentleman who was admitted to the  hospital with chest pain suggestive of unstable angina with significant ECG  changes in the lateral leads.  Given this abnormal ECG, he underwent risk  stratification and is found to have a non-Q-wave myocardial infarction.  Hence, he was brought to the cardiac catheterization laboratory to evaluate  his coronary anatomy.   HEMODYNAMIC DATA:  The left ventricular pressure 112/5 with an end-diastolic  pressure of 14 mmHg. The aortic pressure 104/47 with a mean of 65 mmHg.  There was no pressure gradient across the aortic valve.   ANGIOGRAPHIC DATA:   LEFT VENTRICULOGRAM:  The left ventricular systolic function was in the  lower limit of normal to mildly depressed.  The ejection fraction was  estimated at 45-50%.  There was no mitral regurgitation.  There was inferior  and inferoseptal akinesis.   Right coronary artery:  The right coronary artery is a large caliber vessel  and is occluded in the proximal segment.  Distally it is very large, gives  large PLA and PDA branches, and is collateralized by the left anterior  descending artery.   Left main coronary artery:  The left main coronary  artery is a large caliber  vessel. It has diffuse calcification.  No significant lumen irregularity.   Left anterior descending artery:  Left anterior descending artery is a large  caliber vessel.  It wraps around the apex.  The proximal segment has 20-30%  lumen irregularities.  There is also a small diagonal #1 at that spot. The  LAD gives collaterals to the right coronary artery.   Circumflex coronary artery:  The circumflex is a large caliber vessel. It  gives origin to a large obtuse marginal #1 and continues as obtuse marginal  #2.  There is some diffuse calcification noted, but no significant luminal  irregularities.   ABDOMINAL AORTOGRAM:  Abdominal aortogram revealed widely patent renal  arteries, one on either side.  There is no evidence of abdominal aortic  aneurysm.  The aortoiliac bifurcation was widely patent.    INTERVENTIONAL DATA:  1. Successful thrombectomy of the occluded right coronary  artery.  Excellent     flow was demonstrated post thrombectomy with the Export catheter.  2. Successful percutaneous transluminal coronary angioplasty and stenting of     the mid right coronary artery with a 2.5 x 13 mm Cypher stent overlapped     with a 2.5 x 8 mm Cypher stent in its inflow.  The stents were deployed     at 18 atmospheres of pressure.  Both stents were post-dilated with a 2.0     x 20 mm CrossSail balloon at 10 atmospheres of pressure. TIMI-0 to TIMI-3     flow at the end of the procedure.  No evidence of dissection.  No evidence of thrombosis with excellent blush  noted at the end of the procedure.   TECHNIQUE OF PROCEDURE:  Under the usual sterile precautions, using a 6  French right femoral artery access, and a 6 [place]  multipurpose B2 catheter  was advanced into the ascending aorta over a 0.035 inch J wire.  The  catheter was gently advanced to the left ventricle and left ventricular  pressures were monitored.  Hand contrast injection of the left ventricle  was  performed both in the LAO and RAO projections. The catheter was flushed with  saline, pulled back into the ascending aorta and pressure gradient across  the aortic valve was monitored.  The right coronary artery was selectively  engaged and angiography was performed.  Then the catheter was pulled back  into the arch of the aorta, left subclavian artery was selectively  cannulated and angiography was performed including visualization of the LIMA  which revealed mild atherosclerotic changes of the subclavian, but widely  patent subclavian and widely patent LIMA.  The catheter was pulled back into  the abdominal aorta and abdominal aortogram was performed.  Then the  catheter was pulled out of the body.  A 6 French Judkins left diagnostic  catheter was advanced into the ascending aorta in a similar fashion.  The  left main pulmonary artery was selectively engaged and angiography was  performed.   TECHNIQUE OF INTERVENTION:  A 7 [place] FL4 guide was utilized to engage the  right coronary artery.  Side holes were created at the table with the help  of access needle. The right coronary artery was selectively engaged.  Then  180 cm x 0.014 inch ATW guide wire was advanced into the right coronary  artery.  Initially, there was some residence in the mid segment of the right  coronary artery at the occlusion site.  However, the wire easily passed and  was carefully positioned in the distal right coronary artery.  Then  angiography was performed.  An Export thrombectomy catheter was advanced  over this guide wire and multiple thrombectomies were performed with the  Export catheter and there was white thrombosis collected in the filter from  the Express syringe. Then angiography was performed.  Excellent flow was  demonstrated from TIMI-1.  It had improved to TIMI-3 flow.  There was significant luminal irregularities.  Then a 2.0 x 20 mm CrossSail balloon  was advanced over this guide wire and  multiple angioplasties were performed  at 4 atmospheres of pressure in the mid segment and also in the proximal to  mid segment areas.  After performing the balloon dilatations for 60 seconds  each, the balloon was deflated, pulled back into the guiding catheter.  Arteriography was performed.  Excellent results were noted, then there was  spasm noted in the distal right  coronary artery, which was relieved with  intracoronary nitroglycerin 100 mcg administration.  Then at the original  occluded site which appeared to have dissection, a 2.5 x 13 mm Cypher stent  was advanced and the stent was deployed at 18 atmospheres of pressure for 60  seconds and the balloon was deflated, pulled back in the guiding catheter,  and arteriography was performed.  At the inflow of the stent, there was  still residual dissection noted, and the stent may have moved a little bit  distally than anticipated. Hence a 2.5 x 8 mm Cypher stent was advanced over  the same guide wire and the inflow of the previously placed stent was  overlapped and the stent was deployed at 18 atmospheres of pressure and  angiography was performed.  The guide stent balloon was withdrawn out of the  body and a 3.0 x 20 mm CrossSail, which was initially utilized for  performing the angioplasty was utilized and was positioned carefully within  the stent struts and 10 atmospheres of balloon dilatation was performed for  30 seconds.  The balloon was deflated, Integrilin and nitroglycerin was  administration,  angiography was performed.  Excellent results were noted and the guide was  disengaged from the right coronary artery and pulled out of the body in the  usual fashion.  The patient tolerated the procedure well. During the  procedure, Angiomax was utilized.                                                  Cristy Hilts. Jacinto Halim, M.D.    Pilar Plate  D:  04/06/2002  T:  04/06/2002  Job:  [id number]   cc:   Gaynelle Cage  [address]   [place]  [place] [address]  Fax: [phone]   Madaline Savage, M.D.  [phone] [address], [address]  [place]  [place] [address]  Fax: [phone]

## 2010-07-21 NOTE — H&P (Signed)
NAME:  Frank Fleming, Frank Fleming                        ACCOUNT NO.:  [id number]   MEDICAL RECORD NO.:  [id number]                   PATIENT TYPE:  INP   LOCATION:  IC03                                 FACILITY:  [facility]   PHYSICIAN:  Sarita Bottom, M.D.                  DATE OF BIRTH:  1923-12-10   DATE OF ADMISSION:  04/03/2002  DATE OF DISCHARGE:                                HISTORY & PHYSICAL   PRIMARY MEDICAL DOCTOR:  The patient's primary MD is Dr. Reola Calkins of [facility].   CHIEF COMPLAINT:  I have chest pain.   HISTORY OF PRESENT ILLNESS:  The patient is a 77 year old man with no  significant medical problems in the past who was apparently well until this  morning about 8 a.m. when he woke up with a dull aching chest pain located  diffusely on both left and right chest walls, it was graded 2/10, the pain  was not associated with any nausea, no palpitations, no dizziness, the pain  did not radiate.  He took some aspirin and Rolaids and went about his normal  daily duties.  He later went to see his primary MD who recommended that he  come to the ER for evaluation.  In the emergency room, his cardiac enzymes  were noted to be elevated.  The decision was therefore made to admit him.  The patient does not have any chest pain at the moment.  He said the chest  pain initially lasted for about 2 hours.   REVIEW OF SYSTEMS:  He denies any fever, shortness of breath, cough,  palpitations, or dizziness.  He has no dysuria or diarrhea.  His appetite is  okay.  All other systems reviewed and were negative.   PAST MEDICAL HISTORY:  He denies hypertension.  He denies history of  diabetes.  He denies any history of hypercholesterolemia.  He exercises  regularly.   PAST SURGICAL HISTORY:  He has been operated on for hernia two times.   MEDICATIONS:  He takes aspirin 81 mg once daily and over-the-counter Rolaids  when necessary.   ALLERGIES:  He says he is allergic to  PENICILLIN.   FAMILY HISTORY:  His mother died from cancer.  His father died from old age.   SOCIAL HISTORY:  He is a widower.  He has two kids.  He does not drink.  He  stopped smoking 30 years ago.   PHYSICAL EXAMINATION:  VITAL SIGNS:  His BP is 141/60, heart rate is 62,  respirations are 18, temperature is 96.2.  GENERAL:  He is an elderly man not in any apparent distress.  HEAD/EARS/NOSE AND THROAT:  He is not pale.  He is anicteric.  Pupils are  equal and reactive to light and accommodation.  NECK:  His neck is supple.  There is no jugular venous distention.  There is  no lymphadenopathy.  CHEST:  Air entry is good bilaterally.  Breath sounds are vesicular.  No  wheezes or crackles are heard.  CVS:  Heart sounds 1 and 2 are normal.  Rhythm is regular.  Rate is normal.  No murmurs are appreciated.  ABDOMEN:  Soft, bowel sounds are present.  He has herniorrhaphy scars  bilaterally.  CNS:  He is alert and oriented x3.  He has no gross or focal deficits.  EXTREMITIES:  He has no pedal edema.  Pulses are 2+ bilaterally.   LABORATORY DATA AND DIAGNOSTICS:  WBC is 10.5, neutrophil is 82%, lymphocyte  is 4%, hemoglobin of 13.3, MCV of 96.6, platelets of 238.  Sodium is 137,  potassium is 4.2, chloride of 105, CO2 of 13, BUN of 15, creatinine of 1.0,  glucose of 111, calcium of 8.4.  CK is 247, MB fraction is 30.2, troponin is  1.24.  His EKG is normal sinus rhythm at 68 beats per minute, normal  electrical axis, normal intervals, there is questionable left atrial  enlargement, he has slightly depressed ST segment in V5 and V6, there are no  acute T-wave changes.  His chest x-ray shows mild COPD and chronic  bronchitic changes, there are no acute infiltrates.   ASSESSMENT AND PLAN:  Problem 1. CHEST PAIN AT REST WITH ABNORMAL CARDIAC  ENZYMES PROBABLY DUE TO UNSTABLE ANGINA.  The patient will be admitted to  telemetry.  He will be put on aspirin.  He will be given beta blockers.  We   will give him Lovenox at this time and give him nitroglycerin p.r.n.  We  will do serial cardiac enzymes and EKG and check his lipid profile.  We will  call cardiology for further evaluation.   Problem 2. HYPERTENSION.  The patient's blood pressure will be monitored on  the metoprolol and we may add a second agent if his blood pressure remains  elevated on this regimen.  The patient to be admitted under the hospitalist  service.  Further workup will depend on the patient's clinical course.                                               Sarita Bottom, M.D.    DW/MEDQ  D:  04/03/2002  T:  04/03/2002  Job:  [id number]   cc:   Dr. Ok [facility]

## 2010-07-21 NOTE — Discharge Summary (Signed)
   NAME:  Frank Fleming, [name]                        ACCOUNT NO.:  [id number]   MEDICAL RECORD NO.:  [id number]                   PATIENT TYPE:  INP   LOCATION:  IC03                                 FACILITY:  [facility]   PHYSICIAN:  Sarita Bottom, M.D.                  DATE OF BIRTH:  03-06-1923   DATE OF ADMISSION:  04/03/2002  DATE OF DISCHARGE:  04/04/2002                                 DISCHARGE SUMMARY   BRIEF SUMMARY:  The patient is a 77 year old man with no significant medical  problems who was admitted on 04/03/02 when he presented to the emergency  room with a complaint of chest pain earlier in the day.  His physical  examination on admission was significant for a BP of 141/60 with a heart  rate of 62 otherwise, it was essentially unremarkable.  His admission  laboratory investigation showed an elevated cardiac troponin of 1.2 and  elevated CK-MB.  The patient was admitted with an impression of acute  coronary syndrome and he was started on aspirin, a beta blocker and Lovenox.  His serial cardiac enzymes were remarkable for elevation of the patient's  troponin to 2.5.  Cardiology consult was called and they recommended that  the patient be transferred to [facility] for further management.  The patient was admitted to [facility] under the care of Dr.  Chanda Busing.  His discharge condition was stable.                                               Sarita Bottom, M.D.    DW/MEDQ  D:  04/04/2002  T:  04/04/2002  Job:  [id number]   cc:   Dr. Ok [facility]

## 2010-07-21 NOTE — Procedures (Signed)
[facility]  Patient:    Frank Fleming, Frank Fleming                       MRN: [id number] Proc. Date: 10/08/00 Adm. Date:  [id number] Attending:  Nelda Marseille CC:         Monica Becton, M.D.   Procedure Report  PROCEDURE:  Colonoscopy with polypectomy.  INDICATIONS:  Family history of colon cancer due for colonic screening.  INFORMED CONSENT:  Consent was signed after risk, benefits, methods and options were thoroughly discussed in the office.  MEDICINES USED:  Demerol 70 mg, Versed 8 mg.  DESCRIPTION OF PROCEDURE:  Rectal inspection was pertinent for external hemorrhoids.  Digital exam was negative.  The video colonoscope was inserted and easily advanced around the colon to the cecum.  This did not require any abdominal pressure or any position changes.  On insertion in the mid descending, some diverticula were seen and a small 3 mm polyp just next to one which was hot biopsied x 1.  No other abnormalities were seen on insertion. The cecum was identified by the appendiceal orifice and the ileocecal valve. In fact, the scope was inserted a short ways into the terminal ileum which was normal.  Photodocumentation was obtained.  The prep was adequate.  There was some liquid stool that required washing and suctioning.  On slow withdrawal through the colon, the cecum, ascending, and transverse were normal.  In the descending more proximally, a 3 mm polyp was seen and was hot biopsied x 1. The scope was further withdrawn back to the previous polypectomy site, and one more hot biopsy of possible residual adenomatous tissue was obtained.  There was also a small sigmoid and rectal polyp also hot biopsied x 2.  They were some occasional left-sided diverticula but no other abnormalities.  Once back in the scope was retroflexed, pertinent for some internal hemorrhoids.  The scope was then straightened and advanced a short ways up the sigmoid, air was suctioned,  and the scope removed.  The patient tolerated the procedure well, and there was no obvious immediate complication.  ENDOSCOPIC DIAGNOSES: 1. Internal and external hemorrhoids. 2. Occasional left-sided diverticula. 3. Four rectosigmoid and descending small polyps, hot biopsied. 4. Otherwise within normal limits to the terminal ileum.  PLAN:  Await pathology to determine future colonic screening.  One week customary postpolypectomy instructions.  GI follow-up p.r.n.  Otherwise yearly rectals, guaiacs and other health care maintenance per Dr. Christell Constant. DD:  10/08/00 TD:  10/09/00 Job: [id number]

## 2010-07-21 NOTE — Discharge Summary (Signed)
Frank Fleming, Frank Fleming              ACCOUNT NO.:  [id number]   MEDICAL RECORD NO.:  [id number]          PATIENT TYPE:  INP   LOCATION:  6533                         FACILITY:  [facility]   PHYSICIAN:  Cristy Hilts. Jacinto Halim, MD       DATE OF BIRTH:  1923-09-08   DATE OF ADMISSION:  04/14/2007  DATE OF DISCHARGE:  04/15/2007                               DISCHARGE SUMMARY   DISCHARGE DIAGNOSES:  1. Claudication, peripheral angiogram this admission showing bilateral      disease with one-vessel runoff on the right and two-vessel runoff      on the left.  2. Coronary disease with prior right coronary artery angioplasty in      2004.  3. Treated hypertension.  4. Treated dyslipidemia.  5. Penicillin allergy.   The patient is a 77 year old male with coronary disease, peripheral  vascular disease.  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  Recently, he has noticed  increasing discomfort in his legs consistent with claudication.  He was  seen by Dr. Jacinto Halim in the office of April 09, 2007, and set up for  peripheral angiogram.  This was done April 14, 2007.  She revealed a  95% right common femoral artery stenosis and 70-80% left femoral artery  stenosis.  The patient was seen in consult by Dr. Arbie Cookey.  Plan is for  elective surgical intervention 1-2 weeks.  The patient was discharged  April 15, 2007.  He will have Dopplers in the office after his  intervention.   DISCHARGE MEDICATIONS:  1. Lisinopril 40 mg a day.  2. Aspirin 81 mg a day.  3. Coreg 12.5 mg b.i.d.  4. Crestor 20 mg h.s.  5. Plavix 75 mg a day.   LABORATORY DATA:  White count 7.5, hemoglobin 12.6, hematocrit 36.7,  platelets 196.  Sodium 134, potassium 4.1, BUN 13, creatinine 1.17.   DISPOSITION:  The patient discharged in stable condition and will follow  up with Dr. Arbie Cookey as an outpatient.      Abelino Derrick, P.A.      Cristy Hilts. Jacinto Halim, MD  Electronically  Signed    LKK/MEDQ  D:  05/05/2007  T:  05/05/2007  Job:  [id number]   cc:   Ernestina Penna, M.D.  [name], M.D.

## 2010-07-21 NOTE — Discharge Summary (Signed)
NAME:  Frank Fleming, Frank Fleming                        ACCOUNT NO.:  [id number]   MEDICAL RECORD NO.:  [id number]                   PATIENT TYPE:  INP   LOCATION:  6533                                 FACILITY:  [facility]   PHYSICIAN:  Raymon Mutton, P.A.              DATE OF BIRTH:  February 23, 1924   DATE OF ADMISSION:  04/04/2002  DATE OF DISCHARGE:  04/07/2002                                 DISCHARGE SUMMARY   DISCHARGE DIAGNOSES:  1. Status post acute myocardial infarction, non Q wave, and treated with     percutaneous transluminal coronary angioplasty and stenting of the right     coronary artery.  2. Hyperlipidemia.  3. Hypertension.  4. Chronic obstructive pulmonary disease.  5. Bilateral femoral artery bleed.  6. Status post bilateral hernia repaired, inguinal.   HISTORY OF PRESENT ILLNESS:  This 77 year old gentleman was admitted to the  [facility] with complaints of chest pain.  His labs were positive  for myocardial infarction.  The patient was transferred to [facility] for a more extensive cardiac evaluation and catheterization with  possible intervention.   The patient stated that for the last couple of days he had been having dull  ache in the chest which kind of worsened after he was working out in the  yard doing his regular activity.  The duration of this pain was  approximately a couple of hours and was not associated with initial  shortness of breath, or palpitations.  The patient has no diaphoresis and no  radiation of pain.  The patient was not far from his doctor's office and  decided to stop by to tell him about it.  An EKG was done and it showed  abnormal tracing.  The patient was sent to the emergency hospital.  Since  admission to [facility] the patient was chest pain free and was  transferred to [facility] with mild shortness of breath.   His cardiac panel was positive, 2 sets of CK 247, MB 30.12, troponin 1.124.  The second  set showed CK 269, CK/MB 33, and troponin 2.10.  The third set  showed CK 272, MB 28 and troponin I 2.52.   EKG revealed nonspecific ST-T wave abnormalities with large R waves in V2.   Chest x-ray revealed mild COPD and chronic bronchitis.   HOME MEDICATIONS:  Aspirin 325 mg.   PAST MEDICAL HISTORY:  Negative for hypertension or coronary artery disease  and no risk factors revealed to coronary artery disease such as diabetes.   SOCIAL HISTORY:  No tobacco.  Physically active.   FAMILY HISTORY:  Significant for cancer, but no family history of CVA or  coronary artery disease.   HOSPITAL COURSE:  The patient was admitted to the telemetry on February 2  and he underwent cardiac catheterization by Dr.  Theophilus Bones.  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  There  was a large vessel  distally gave large vessel in distal gave large PLA and PDA which were  collateralized by the left anterior descending artery.   The patient underwent successful thrombectomy of the occluded right coronary  artery with excellent post-thrombectomy flow with export catheter and  subsequently successful percutaneous transluminal coronary angioplasty and  stenting plus the site for stent 2.5 over 15 mm over lapping with another  site for stent of 2.5 over 8 mm was performed.  The patient tolerated the  procedure well.  Postprocedure TIMI 3 flow was demonstrated.   The patient remained stable throughout admission and postprocedure labs  showed creatinine 1.0, BUN 16, potassium 3.8, sodium 138.  Hemoglobin  postprocedure was 10.4; hematocrit 31.2.   DISCHARGE CONDITION:  The patient was discharged home in stable condition,  free of chest pain.   DISCHARGE MEDICATIONS:  1. Metoprolol 25 mg b.i.d.  2. Aspirin 81 mg daily.  3. Zocor 40 mg daily.  4. Plavix 75 mg daily.  5. Altace 2.5 mg b.i.d.  6. Nitroglycerin 0.4 mg sublingual p.r.n.   DISCHARGE INSTRUCTIONS:  1. Activity:  The  patient was advised not to engage in any strenuous     activity, no lifting greater than 5 pounds and no driving for the period     of time until he is seen by Dr. Theophilus Bones.  2. Low salt diet.  3. He is allowed to take a shower and instructed to wash catheterization     site in the groin gently with mild soap and while drying, pat dry.  4. Should any problems arise patient was encouraged to call our office and     number was provided.  5. Discharge follow up: Scheduled with Dr. Theophilus Bones on 04/24/2002 at 9:30     a.m.                                               Raymon Mutton, P.A.    MK/MEDQ  D:  05/05/2002  T:  05/05/2002  Job:  [id number]   cc:   Venetia Maxon  [address]  [place]  [place] [address]  Fax: [phone]   In [place] and [facility]

## 2010-11-24 LAB — CBC
HCT: 36.7 — ABNORMAL LOW
Hemoglobin: 12.6 — ABNORMAL LOW
MCHC: 34.3
MCV: 92.8
RBC: 3.96 — ABNORMAL LOW
WBC: 7.5

## 2010-11-24 LAB — BASIC METABOLIC PANEL
CO2: 27
Chloride: 104
Creatinine, Ser: 1.17
GFR calc Af Amer: >60
Potassium: 4.1

## 2010-11-27 ENCOUNTER — Other Ambulatory Visit: Payer: Self-pay | Admitting: Cardiology

## 2010-11-30 LAB — ABO/RH: ABO/RH(D): A POS

## 2010-11-30 LAB — COMPREHENSIVE METABOLIC PANEL
ALT: 17
Alkaline Phosphatase: 39
BUN: 15
CO2: 25
GFR calc non Af Amer: 58 — ABNORMAL LOW
Glucose, Bld: 121 — ABNORMAL HIGH
Potassium: 4.3
Sodium: 138
Total Bilirubin: 0.8

## 2010-11-30 LAB — CBC
HCT: 37.3 — ABNORMAL LOW
MCHC: 33.9
MCV: 93.3
MCV: 93.3
Platelets: 210
RBC: 3.35 — ABNORMAL LOW
RDW: 12.9
WBC: 14 — ABNORMAL HIGH

## 2010-11-30 LAB — URINALYSIS, ROUTINE W REFLEX MICROSCOPIC
Bilirubin Urine: NEGATIVE
Glucose, UA: NEGATIVE
Hgb urine dipstick: NEGATIVE
Ketones, ur: NEGATIVE
Protein, ur: NEGATIVE

## 2010-11-30 LAB — BASIC METABOLIC PANEL
Chloride: 111
Creatinine, Ser: 1.06
GFR calc Af Amer: >60
Sodium: 140

## 2010-11-30 LAB — TYPE AND SCREEN: ABO/RH(D): A POS

## 2010-11-30 LAB — PROTIME-INR: Prothrombin Time: 13.3

## 2011-06-27 ENCOUNTER — Telehealth: Payer: Self-pay | Admitting: Cardiology

## 2011-06-27 NOTE — Telephone Encounter (Signed)
New Problem:     I contacted the patient to attempt to make an appointment and was told to just cancel his reminder out.

## 2012-08-12 ENCOUNTER — Ambulatory Visit: Payer: Self-pay | Admitting: Family Medicine

## 2012-12-11 ENCOUNTER — Ambulatory Visit: Payer: Self-pay | Admitting: Family Medicine

## 2012-12-17 ENCOUNTER — Encounter (INDEPENDENT_AMBULATORY_CARE_PROVIDER_SITE_OTHER): Payer: Self-pay

## 2012-12-17 ENCOUNTER — Ambulatory Visit (INDEPENDENT_AMBULATORY_CARE_PROVIDER_SITE_OTHER): Payer: Medicare Other | Admitting: General Practice

## 2012-12-17 DIAGNOSIS — L989 Disorder of the skin and subcutaneous tissue, unspecified: Secondary | ICD-10-CM

## 2012-12-17 NOTE — Progress Notes (Signed)
  Subjective:    Patient ID: Frank Fleming, male    DOB: [birth year], 79 y.o.   MRN: [id number]  HPI Patient presents today with complaints of skin lesion to left upper chest that bleeds periodically. He reports noticing the lesion three months ago. Reports using antibiotic ointment occasionally. He denies fever.     Review of Systems  Constitutional: Negative for fever and chills.  Respiratory: Negative for chest tightness and shortness of breath.   Cardiovascular: Negative for chest pain.  Skin:       Lesion to left upper chest  Neurological: Negative for dizziness, weakness and headaches.       Objective:   Physical Exam  Constitutional: He is oriented to person, place, and time. He appears well-developed and well-nourished.  Cardiovascular: Regular rhythm and normal heart sounds.   Pulmonary/Chest: Effort normal and breath sounds normal.  Neurological: He is alert and oriented to person, place, and time.  Skin: Skin is warm and dry.  1/4 inch x 1/4 inch skin colored lesion noted to left upper chest. Negative drainage.  Psychiatric: He has a normal mood and affect.          Assessment & Plan:  1. Abdominal  pain, other specified site   2. Chest skin lesion - Ambulatory referral to Dermatology -RTO if symptoms worsen or unresolved -Patient verbalized understanding -Coralie Keens, FNP-C

## 2012-12-25 ENCOUNTER — Other Ambulatory Visit: Payer: Self-pay | Admitting: General Practice

## 2012-12-25 ENCOUNTER — Telehealth: Payer: Self-pay | Admitting: General Practice

## 2012-12-25 DIAGNOSIS — L989 Disorder of the skin and subcutaneous tissue, unspecified: Secondary | ICD-10-CM

## 2012-12-25 NOTE — Telephone Encounter (Signed)
Referral request made, he should be contacted with updated information.

## 2013-01-12 ENCOUNTER — Telehealth: Payer: Self-pay | Admitting: Family Medicine

## 2013-01-26 ENCOUNTER — Other Ambulatory Visit: Payer: Self-pay | Admitting: Physician Assistant

## 2013-02-09 NOTE — Telephone Encounter (Signed)
Left detailed message to call for follow up.

## 2013-05-27 ENCOUNTER — Telehealth: Payer: Self-pay | Admitting: Family Medicine

## 2013-05-28 ENCOUNTER — Ambulatory Visit (INDEPENDENT_AMBULATORY_CARE_PROVIDER_SITE_OTHER): Payer: Medicare Other

## 2013-05-28 ENCOUNTER — Encounter: Payer: Self-pay | Admitting: Family Medicine

## 2013-05-28 ENCOUNTER — Ambulatory Visit (INDEPENDENT_AMBULATORY_CARE_PROVIDER_SITE_OTHER): Payer: Medicare Other | Admitting: Family Medicine

## 2013-05-28 VITALS — BP 148/65 | HR 78 | Temp 98.3°F | Ht 67.0 in | Wt 132.0 lb

## 2013-05-28 DIAGNOSIS — M25569 Pain in unspecified knee: Secondary | ICD-10-CM

## 2013-05-28 DIAGNOSIS — I2510 Atherosclerotic heart disease of native coronary artery without angina pectoris: Secondary | ICD-10-CM

## 2013-05-28 DIAGNOSIS — R5381 Other malaise: Secondary | ICD-10-CM

## 2013-05-28 DIAGNOSIS — M25562 Pain in left knee: Secondary | ICD-10-CM

## 2013-05-28 DIAGNOSIS — I10 Essential (primary) hypertension: Secondary | ICD-10-CM

## 2013-05-28 DIAGNOSIS — R413 Other amnesia: Secondary | ICD-10-CM | POA: Insufficient documentation

## 2013-05-28 DIAGNOSIS — R5383 Other fatigue: Secondary | ICD-10-CM

## 2013-05-28 DIAGNOSIS — I7090 Unspecified atherosclerosis: Secondary | ICD-10-CM

## 2013-05-28 NOTE — Telephone Encounter (Signed)
appt made

## 2013-05-28 NOTE — Progress Notes (Signed)
Subjective:    Patient ID: Frank Fleming, male    DOB: May 30, 1923, 80 y.o.   MRN: [id number]  HPI Patient here today for pain behind left knee. This started several months ago and has got worse in the last 6 weeks. The patient comes to the visit today with his son. The left knee pain is causing him to lay out. He has had a history of a DVT. He has a history of stents for coronary artery disease. He also has problems with his memory.       Patient Active Problem List   Diagnosis Date Noted  . HYPERCHOLESTEROLEMIA 11/14/2009  . UNSPECIFIED ESSENTIAL HYPERTENSION 11/14/2009  . CORONARY ATHEROSCLEROSIS NATIVE CORONARY ARTERY 11/14/2009  . PVD 11/14/2009   No outpatient encounter prescriptions on file as of 05/28/2013.    Review of Systems  Constitutional: Negative.   HENT: Negative.   Eyes: Negative.   Respiratory: Negative.   Cardiovascular: Negative.   Gastrointestinal: Negative.   Endocrine: Negative.   Genitourinary: Negative.   Musculoskeletal: Positive for arthralgias (left - behind the knee pain).  Skin: Negative.   Allergic/Immunologic: Negative.   Neurological: Negative.   Hematological: Negative.   Psychiatric/Behavioral: Positive for confusion (more confusion than normal).       Objective:   Physical Exam  Nursing note and vitals reviewed. Constitutional: He is oriented to person, place, and time. He appears well-developed and well-nourished. No distress.  Pleasant  HENT:  Head: Normocephalic and atraumatic.  Right Ear: External ear normal.  Left Ear: External ear normal.  Nose: Nose normal.  Mouth/Throat: Oropharynx is clear and moist. No oropharyngeal exudate.  Eyes: Conjunctivae and EOM are normal. Pupils are equal, round, and reactive to light. Right eye exhibits no discharge. Left eye exhibits no discharge. No scleral icterus.  Neck: Normal range of motion. Neck supple. No thyromegaly present.  Cardiovascular: Normal rate, regular rhythm and normal  heart sounds.  Exam reveals no gallop and no friction rub.   No murmur heard. At 72 per minute  Pulmonary/Chest: Effort normal and breath sounds normal. No respiratory distress. He has no wheezes. He has no rales. He exhibits no tenderness.  Abdominal: Soft. Bowel sounds are normal. He exhibits no mass. There is no tenderness. There is no rebound and no guarding.  Musculoskeletal: Normal range of motion. He exhibits no edema and no tenderness.  Good flexion of left knee but tender specifically at the left medial joint line. The Homans sign is negative and there was no fullness in the popliteal area. There was no rubor or tenderness in the calf and there was no edema  Lymphadenopathy:    He has no cervical adenopathy.  Neurological: He is alert and oriented to person, place, and time. He has normal reflexes. No cranial nerve deficit.  See the results of the many mental status exam he scored 20 and 30 this will be scanned into the record.  Skin: Skin is warm and dry. No rash noted. No erythema. No pallor.  Psychiatric: He has a normal mood and affect. His behavior is normal. Judgment and thought content normal.   [facility] reading (PRIMARY) by  Dr.Moore-chest x-ray--no active disease                                                                   -  Left knee  --degenerative changes                                      Assessment & Plan:   1. Memory impairment -For future lab work order  2. Left knee pain - DG Knee 1-2 Views Left; Future -Recheck in 2 weeks and consider injection and initiation of medication for memory  3. ASCVD (arteriosclerotic cardiovascular disease) - DG Chest 2 View; Future -Appointment with a cardiologist  Patient Instructions  Please be careful and do not put yourself at risk for falling Take Tylenol as needed for pain We will arrange for you to have a visit with a cardiologist because of your history of heart disease We will call you with the results of the lab  work that we will have done tomorrow as soon as that result is available We will also call you with the results of the chest x-ray once those results become available   Nyra Capeson W. Moore MD

## 2013-05-28 NOTE — Patient Instructions (Signed)
Please be careful and do not put yourself at risk for falling Take Tylenol as needed for pain We will arrange for you to have a visit with a cardiologist because of your history of heart disease We will call you with the results of the lab work that we will have done tomorrow as soon as that result is available We will also call you with the results of the chest x-ray once those results become available

## 2013-05-29 ENCOUNTER — Other Ambulatory Visit (INDEPENDENT_AMBULATORY_CARE_PROVIDER_SITE_OTHER): Payer: Medicare Other

## 2013-05-29 ENCOUNTER — Telehealth: Payer: Self-pay | Admitting: Cardiology

## 2013-05-29 DIAGNOSIS — M25562 Pain in left knee: Secondary | ICD-10-CM

## 2013-05-29 DIAGNOSIS — I7090 Unspecified atherosclerosis: Secondary | ICD-10-CM

## 2013-05-29 DIAGNOSIS — R5383 Other fatigue: Secondary | ICD-10-CM

## 2013-05-29 DIAGNOSIS — R5381 Other malaise: Secondary | ICD-10-CM

## 2013-05-29 DIAGNOSIS — R413 Other amnesia: Secondary | ICD-10-CM

## 2013-05-29 DIAGNOSIS — I10 Essential (primary) hypertension: Secondary | ICD-10-CM

## 2013-05-29 DIAGNOSIS — M25569 Pain in unspecified knee: Secondary | ICD-10-CM

## 2013-05-29 DIAGNOSIS — I2510 Atherosclerotic heart disease of native coronary artery without angina pectoris: Secondary | ICD-10-CM

## 2013-05-29 LAB — POCT CBC
Granulocyte percent: 64.6 %G (ref 37–80)
HCT, POC: 39.1 % — AB (ref 43.5–53.7)
HEMOGLOBIN: 12.6 g/dL — AB (ref 14.1–18.1)
Lymph, poc: 2.2 (ref 0.6–3.4)
MCH: 29.7 pg (ref 27–31.2)
MCHC: 32.2 g/dL (ref 31.8–35.4)
MCV: 92.3 fL (ref 80–97)
MPV: 8.9 fL (ref 0–99.8)
POC Granulocyte: 5.2 (ref 2–6.9)
POC LYMPH %: 27.2 % (ref 10–50)
Platelet Count, POC: 239 10*3/uL (ref 142–424)
RBC: 4.2 M/uL — AB (ref 4.69–6.13)
RDW, POC: 13.7 %
WBC: 8.1 10*3/uL (ref 4.6–10.2)

## 2013-05-29 NOTE — Telephone Encounter (Signed)
OK 

## 2013-05-29 NOTE — Progress Notes (Signed)
Pt came in for labs only 

## 2013-05-30 LAB — BMP8+EGFR
BUN / CREAT RATIO: 20 (ref 10–22)
BUN: 21 mg/dL (ref 8–27)
CO2: 22 mmol/L (ref 18–29)
CREATININE: 1.06 mg/dL (ref 0.76–1.27)
Calcium: 9 mg/dL (ref 8.6–10.2)
Chloride: 103 mmol/L (ref 97–108)
GFR, EST AFRICAN AMERICAN: 73 mL/min/{1.73_m2} (ref >59)
GFR, EST NON AFRICAN AMERICAN: 63 mL/min/{1.73_m2} (ref >59)
Glucose: 93 mg/dL (ref 65–99)
POTASSIUM: 4.6 mmol/L (ref 3.5–5.2)
SODIUM: 140 mmol/L (ref 134–144)

## 2013-05-30 LAB — LIPID PANEL
CHOLESTEROL TOTAL: 210 mg/dL — AB (ref 100–199)
Chol/HDL Ratio: 3.6 ratio units (ref 0.0–5.0)
HDL: 59 mg/dL (ref >39)
LDL Calculated: 120 mg/dL — ABNORMAL HIGH (ref 0–99)
Triglycerides: 157 mg/dL — ABNORMAL HIGH (ref 0–149)
VLDL CHOLESTEROL CAL: 31 mg/dL (ref 5–40)

## 2013-05-30 LAB — HEPATIC FUNCTION PANEL
ALK PHOS: 54 IU/L (ref 39–117)
ALT: 11 IU/L (ref 0–44)
AST: 18 IU/L (ref 0–40)
Albumin: 4 g/dL (ref 3.5–4.7)
BILIRUBIN DIRECT: 0.1 mg/dL (ref 0.00–0.40)
BILIRUBIN TOTAL: 0.4 mg/dL (ref 0.0–1.2)
Total Protein: 5.8 g/dL — ABNORMAL LOW (ref 6.0–8.5)

## 2013-05-30 LAB — VITAMIN D 25 HYDROXY (VIT D DEFICIENCY, FRACTURES): VIT D 25 HYDROXY: 30 ng/mL (ref 30.0–100.0)

## 2013-05-31 NOTE — Telephone Encounter (Signed)
That would be fine 

## 2013-06-01 NOTE — Telephone Encounter (Signed)
Appointment scheduled with Dr. Antoine PocheHochrein on 07/29/2013 @ 1:30, pt notified

## 2013-06-01 NOTE — Addendum Note (Signed)
Addended by: Magdalene RiverBULLINS, JAMIE H on: 06/01/2013 10:34 AM   Modules accepted: Orders

## 2013-06-11 ENCOUNTER — Ambulatory Visit (INDEPENDENT_AMBULATORY_CARE_PROVIDER_SITE_OTHER): Payer: Medicare Other | Admitting: Family Medicine

## 2013-06-11 ENCOUNTER — Encounter: Payer: Self-pay | Admitting: Family Medicine

## 2013-06-11 VITALS — BP 171/81 | HR 73 | Temp 96.7°F | Ht 67.0 in | Wt 131.0 lb

## 2013-06-11 DIAGNOSIS — M25569 Pain in unspecified knee: Secondary | ICD-10-CM

## 2013-06-11 DIAGNOSIS — E559 Vitamin D deficiency, unspecified: Secondary | ICD-10-CM

## 2013-06-11 DIAGNOSIS — I10 Essential (primary) hypertension: Secondary | ICD-10-CM

## 2013-06-11 DIAGNOSIS — R413 Other amnesia: Secondary | ICD-10-CM

## 2013-06-11 DIAGNOSIS — M25562 Pain in left knee: Secondary | ICD-10-CM

## 2013-06-11 MED ORDER — VITAMIN D (ERGOCALCIFEROL) 1.25 MG (50000 UNIT) PO CAPS: 50000.0000 [IU] | ORAL_CAPSULE | ORAL | Status: DC

## 2013-06-11 MED ORDER — DONEPEZIL HCL 5 MG PO TABS: 5.0000 mg | ORAL_TABLET | Freq: Every day | ORAL | Status: DC

## 2013-06-11 NOTE — Progress Notes (Signed)
Subjective:    Patient ID: Frank Fleming, male    DOB: 11/29/1923, 80 y.o.   MRN: [id number]  HPI Patient here today for 2 week follow up for memory and left knee pain. Recent labs were reviewed with the patient. And his son. His LDL cholesterol was elevated. His protein was slightly decreased. His kidney function tests were normal.       Patient Active Problem List   Diagnosis Date Noted  . Memory impairment 05/28/2013  . HYPERCHOLESTEROLEMIA 11/14/2009  . UNSPECIFIED ESSENTIAL HYPERTENSION 11/14/2009  . CORONARY ATHEROSCLEROSIS NATIVE CORONARY ARTERY 11/14/2009  . PVD 11/14/2009   No outpatient encounter prescriptions on file as of 06/11/2013.    Review of Systems  Constitutional: Negative.   HENT: Negative.   Eyes: Negative.   Respiratory: Negative.   Cardiovascular: Negative.   Gastrointestinal: Negative.   Endocrine: Negative.   Genitourinary: Negative.   Musculoskeletal: Positive for arthralgias.  Skin: Negative.   Allergic/Immunologic: Negative.   Neurological: Negative.   Hematological: Negative.   Psychiatric/Behavioral: Negative.        Objective:   Physical Exam  Nursing note and vitals reviewed. Constitutional: He is oriented to person, place, and time. He appears well-developed and well-nourished. No distress.  HENT:  Head: Normocephalic and atraumatic.  Right Ear: External ear normal.  Left Ear: External ear normal.  Nose: Nose normal.  Mouth/Throat: Oropharynx is clear and moist. No oropharyngeal exudate.  Eyes: Conjunctivae and EOM are normal. Pupils are equal, round, and reactive to light. Right eye exhibits no discharge. Left eye exhibits no discharge. No scleral icterus.  Neck: Normal range of motion. Neck supple. No thyromegaly present.  Cardiovascular: Normal rate, regular rhythm, normal heart sounds and intact distal pulses.  Exam reveals no gallop and no friction rub.   No murmur heard. 72 per minute  Pulmonary/Chest: Effort normal and  breath sounds normal. No respiratory distress. He has no wheezes. He has no rales.  Abdominal: Soft. Bowel sounds are normal. He exhibits no mass. There is no tenderness. There is no rebound and no guarding.  Musculoskeletal: Normal range of motion. He exhibits no edema.  Good mobility of both knees with only slight discomfort in the left knee with flexion and extension at joint line  Lymphadenopathy:    He has no cervical adenopathy.  Neurological: He is alert and oriented to person, place, and time.  Skin: Skin is warm and dry. No rash noted. There is erythema. No pallor.  Patient has been sun bathing and he had a lot of redness on his anterior chest and abdomen secondary to exposure to the sun   Psychiatric: He has a normal mood and affect. His behavior is normal. Judgment and thought content normal.   BP 171/81  Pulse 73  Temp(Src) 96.7 F (35.9 C) (Oral)  Ht 5\' 7"  (1.702 m)  Wt 131 lb (59.421 kg)  BMI 20.51 kg/m2        Assessment & Plan:  1. Vitamin D deficiency -Start   D 3 1000  1 day  2. Memory impairment -Start Aricept 5 mg one daily  3. Left knee pain -1 daily after breakfast for 2 week-advil  4. HTN (hypertension) -His son  will monitor his blood pressure at home and we will review these readings in a couple of weeks.   Meds ordered this encounter  Medications  . donepezil (ARICEPT) 5 MG tablet    Sig: Take 1 tablet (5 mg total) by mouth at bedtime.  Dispense:  30 tablet    Refill:  1  . Vitamin D, Ergocalciferol, (DRISDOL) 50000 UNITS CAPS capsule    Sig: Take 1 capsule (50,000 Units total) by mouth every 7 (seven) days.    Dispense:  12 capsule    Refill:  1   Patient Instructions  Take aricept daily as directed Take advil 1 daily after breakfast Be careful not to put yourself at risk for falls Continue walking daily Follow up in 1 month for check up on memory and to increase meds. Start Vit D3  1000, 1 day      Nyra Capes MD

## 2013-06-11 NOTE — Patient Instructions (Addendum)
Take aricept daily as directed Take advil 1 daily after breakfast Be careful not to put yourself at risk for falls Continue walking daily Follow up in 1 month for check up on memory and to increase meds. Start Vit D3  1000, 1 day

## 2013-07-09 ENCOUNTER — Encounter: Payer: Self-pay | Admitting: Family Medicine

## 2013-07-09 ENCOUNTER — Ambulatory Visit (INDEPENDENT_AMBULATORY_CARE_PROVIDER_SITE_OTHER): Payer: Medicare Other | Admitting: Family Medicine

## 2013-07-09 VITALS — BP 146/62 | HR 72 | Temp 97.0°F | Ht 67.0 in | Wt 132.0 lb

## 2013-07-09 DIAGNOSIS — B070 Plantar wart: Secondary | ICD-10-CM

## 2013-07-09 DIAGNOSIS — R413 Other amnesia: Secondary | ICD-10-CM

## 2013-07-09 MED ORDER — DONEPEZIL HCL 10 MG PO TABS: 10.0000 mg | ORAL_TABLET | Freq: Every day | ORAL | Status: DC

## 2013-07-09 NOTE — Progress Notes (Signed)
   Subjective:    Patient ID: Frank Fleming, male    DOB: 01/06/1924, 80 y.o.   MRN: [id number]  HPI Patient here today for 1 month follow up on memory and for check of left foot sore. He is accompanied today by his son 39Micah. The patient is doing well and is apparently tolerating the Endocet without problems. He does complain of 2 plantar warts on the bottom of the left foot.       Patient Active Problem List   Diagnosis Date Noted  . Memory impairment 05/28/2013  . HYPERCHOLESTEROLEMIA 11/14/2009  . UNSPECIFIED ESSENTIAL HYPERTENSION 11/14/2009  . CORONARY ATHEROSCLEROSIS NATIVE CORONARY ARTERY 11/14/2009  . PVD 11/14/2009   Outpatient Encounter Prescriptions as of 07/09/2013  Medication Sig  . donepezil (ARICEPT) 10 MG tablet Take 1 tablet (10 mg total) by mouth at bedtime.  . Vitamin D, Ergocalciferol, (DRISDOL) 50000 UNITS CAPS capsule Take 1 capsule (50,000 Units total) by mouth every 7 (seven) days.  . [DISCONTINUED] donepezil (ARICEPT) 5 MG tablet Take 1 tablet (5 mg total) by mouth at bedtime.    Review of Systems  Constitutional: Negative.   HENT: Negative.   Eyes: Negative.   Respiratory: Negative.   Cardiovascular: Negative.   Gastrointestinal: Negative.   Endocrine: Negative.   Genitourinary: Negative.   Musculoskeletal: Negative.   Skin: Negative.        Sore place on bottom of left foot  Allergic/Immunologic: Negative.   Neurological: Negative.   Hematological: Negative.   Psychiatric/Behavioral: Positive for confusion (memory deficit).       Objective:   Physical Exam BP 146/62  Pulse 72  Temp(Src) 97 F (36.1 C) (Oral)  Ht 5\' 7"  (1.702 m)  Wt 132 lb (59.875 kg)  BMI 20.67 kg/m2  The patient is alert and looks good today. He is smiling and in a good mood. He is moving all his extremities well. He does have 2 plantar warts on the bottom of the left foot which are tender to palpation. There is no sign of any infection. There is no redness.        Assessment & Plan:  1. Memory disorder -Continue Aricept and increase the dose to 10mg  daily -In one month add Namenda XR starter pack -Return to clinic in 3 mo  2. Plantar wart of left foot -Appointment with dermatologist  Patient Instructions  Arrange a visit with a dermatologist to inject the plantar warts on the bottom of the left foot Increase the Aricept to 10 mg a day Start the Namenda XR starter pack on June 7 Call us before you run out of the starter pack and we will call in the Namenda XR 28 mg to take one daily If the knee pain continues to give him no trouble, cancel the appointment with the orthopedic   Nyra Capeson W. Aimee Heldman MD

## 2013-07-09 NOTE — Patient Instructions (Signed)
Arrange a visit with a dermatologist to inject the plantar warts on the bottom of the left foot Increase the Aricept to 10 mg a day Start the Namenda XR starter pack on June 7 Call us before you run out of the starter pack and we will call in the Namenda XR 28 mg to take one daily If the knee pain continues to give him no trouble, cancel the appointment with the orthopedic

## 2013-07-23 ENCOUNTER — Encounter: Payer: Self-pay | Admitting: *Deleted

## 2013-07-29 ENCOUNTER — Ambulatory Visit (INDEPENDENT_AMBULATORY_CARE_PROVIDER_SITE_OTHER): Payer: Medicare Other | Admitting: Cardiology

## 2013-07-29 ENCOUNTER — Encounter: Payer: Self-pay | Admitting: Cardiology

## 2013-07-29 VITALS — BP 182/86 | HR 78 | Ht 67.0 in | Wt 132.0 lb

## 2013-07-29 DIAGNOSIS — I2510 Atherosclerotic heart disease of native coronary artery without angina pectoris: Secondary | ICD-10-CM

## 2013-07-29 DIAGNOSIS — Z79899 Other long term (current) drug therapy: Secondary | ICD-10-CM

## 2013-07-29 DIAGNOSIS — I10 Essential (primary) hypertension: Secondary | ICD-10-CM

## 2013-07-29 MED ORDER — LISINOPRIL 2.5 MG PO TABS: 2.5000 mg | ORAL_TABLET | Freq: Every day | ORAL | Status: DC

## 2013-07-29 NOTE — Progress Notes (Signed)
HPI The patient presents as a new patient for me.   He had NSTEMI in 2004 with Cypher DES to RCA, and he had right common femoral arterectomy by Dr. Arbie CookeyEarly in 2009.  He was seen several years ago by Dr. Jearld PiesMcClean.  He is new to me.  He was referred back for routine follow up.  He has some early dementia.  However, he looks very good for his age.  He walks 1/4 mile twice daily.  With this he denies any symptoms.  The patient denies any new symptoms such as chest discomfort, neck or arm discomfort. There has been no new shortness of breath, PND or orthopnea. There have been no reported palpitations, presyncope or syncope.  He does not like to take meds.   Allergies  Allergen Reactions  . Penicillins   . Zocor [Simvastatin]     Current Outpatient Prescriptions  Medication Sig Dispense Refill  . donepezil (ARICEPT) 10 MG tablet Take 1 tablet (10 mg total) by mouth at bedtime.  30 tablet  5  . Vitamin D, Ergocalciferol, (DRISDOL) 50000 UNITS CAPS capsule Take 1 capsule (50,000 Units total) by mouth every 7 (seven) days.  12 capsule  1   No current facility-administered medications for this visit.    Past Medical History  Diagnosis Date  . CAD (coronary artery disease)   . Colon polyp   . Diverticulosis of colon   . Hyperlipidemia   . Hypertension     Past Surgical History  Procedure Laterality Date  . Angioplasty    . Appendectomy    . Hernia repair      right inguinal  . Tonsillectomy    . Endarterectomy femoral      FH:  No early CAD or other contributing history.    History   Social History  . Marital Status: Single    Spouse Name: N/A    Number of Children: 2  . Years of Education: N/A   Occupational History  . Not on file.   Social History Main Topics  . Smoking status: Former Games developermoker  . Smokeless tobacco: Not on file     Comment: Quit tobacco 40 years ago.   . Alcohol Use: No  . Drug Use: No  . Sexual Activity: Not on file   Other Topics Concern  . Not on  file   Social History Narrative   Lives alone.      ROS:    Joint pains.  Otherwise as stated in the HPI and negative for all other systems.  PHYSICAL EXAM BP 182/86  Pulse 78  Ht 5\' 7"  (1.702 m)  Wt 132 lb (59.875 kg)  BMI 20.67 kg/m2 GENERAL:  Well appearing HEENT:  Pupils equal round and reactive, fundi not visualized, oral mucosa unremarkable NECK:  No jugular venous distention, waveform within normal limits, carotid upstroke brisk and symmetric, no bruits, no thyromegaly LYMPHATICS:  No cervical, inguinal adenopathy LUNGS:  Clear to auscultation bilaterally BACK:  No CVA tenderness CHEST:  Unremarkable HEART:  PMI not displaced or sustained,S1 and S2 within normal limits, no S3, no S4, no clicks, no rubs, no murmurs ABD:  Flat, positive bowel sounds normal in frequency in pitch, no bruits, no rebound, no guarding, no midline pulsatile mass, no hepatomegaly, no splenomegaly EXT:  2 plus pulses upper, absent DP/PT bilateral, left femoral bruit, no edema, no cyanosis no clubbing SKIN:  No rashes no nodules NEURO:  Cranial nerves II through XII grossly intact, motor grossly intact  throughout [facility]:  Cognitively intact, oriented to person place and time   EKG:    NSR, rate 73, RBBB, no acute ST T wave changes.  07/29/2013  ASSESSMENT AND PLAN  CAD:  He has no symptoms and so I will not pursue an screening stress tests.  He would like conservative management.  He does agree to start to take ASA 81 mg daily.  HTN:  His BP has been consistently elevated.  I will restart lisinopril.  He uses to take this years ago. I will check a BMET in 2 weeks.  PVD:  He has no symptoms related to this.

## 2013-07-29 NOTE — Patient Instructions (Signed)
Please start Lisinopril 2.5 mg daily.. Continue all other medications as listed.  Please have blood work in 2 weeks (BMP) at [facility].  Follow up in 6 months with Dr Antoine Poche.  You will receive a letter in the mail 2 months before you are due.  Please call us when you receive this letter to schedule your follow up appointment.

## 2013-08-12 ENCOUNTER — Other Ambulatory Visit (INDEPENDENT_AMBULATORY_CARE_PROVIDER_SITE_OTHER): Payer: Medicare Other

## 2013-08-12 DIAGNOSIS — I10 Essential (primary) hypertension: Secondary | ICD-10-CM

## 2013-08-12 DIAGNOSIS — Z79899 Other long term (current) drug therapy: Secondary | ICD-10-CM

## 2013-08-13 LAB — BMP8+EGFR
BUN/Creatinine Ratio: 17 (ref 10–22)
BUN: 23 mg/dL (ref 8–27)
CALCIUM: 9.2 mg/dL (ref 8.6–10.2)
CO2: 26 mmol/L (ref 18–29)
CREATININE: 1.33 mg/dL — AB (ref 0.76–1.27)
Chloride: 103 mmol/L (ref 97–108)
GFR, EST AFRICAN AMERICAN: 55 mL/min/{1.73_m2} — AB (ref >59)
GFR, EST NON AFRICAN AMERICAN: 47 mL/min/{1.73_m2} — AB (ref >59)
GLUCOSE: 124 mg/dL — AB (ref 65–99)
POTASSIUM: 4.4 mmol/L (ref 3.5–5.2)
Sodium: 141 mmol/L (ref 134–144)

## 2013-08-31 ENCOUNTER — Other Ambulatory Visit: Payer: Self-pay | Admitting: *Deleted

## 2013-08-31 ENCOUNTER — Telehealth: Payer: Self-pay | Admitting: Family Medicine

## 2013-08-31 MED ORDER — MEMANTINE HCL ER 28 MG PO CP24: 1.0000 | ORAL_CAPSULE | Freq: Every day | ORAL | Status: DC

## 2013-09-05 ENCOUNTER — Emergency Department (HOSPITAL_COMMUNITY): Payer: Medicare Other

## 2013-09-05 ENCOUNTER — Emergency Department (HOSPITAL_COMMUNITY)
Admission: EM | Admit: 2013-09-05 | Discharge: 2013-09-05 | Discharge status: Against Medical Advice | Payer: Medicare Other | Attending: Emergency Medicine | Admitting: Emergency Medicine

## 2013-09-05 ENCOUNTER — Encounter (HOSPITAL_COMMUNITY): Payer: Self-pay | Admitting: Emergency Medicine

## 2013-09-05 DIAGNOSIS — Z8739 Personal history of other diseases of the musculoskeletal system and connective tissue: Secondary | ICD-10-CM | POA: Insufficient documentation

## 2013-09-05 DIAGNOSIS — Z79899 Other long term (current) drug therapy: Secondary | ICD-10-CM | POA: Insufficient documentation

## 2013-09-05 DIAGNOSIS — I10 Essential (primary) hypertension: Secondary | ICD-10-CM | POA: Insufficient documentation

## 2013-09-05 DIAGNOSIS — Z8719 Personal history of other diseases of the digestive system: Secondary | ICD-10-CM | POA: Insufficient documentation

## 2013-09-05 DIAGNOSIS — Z862 Personal history of diseases of the blood and blood-forming organs and certain disorders involving the immune mechanism: Secondary | ICD-10-CM | POA: Insufficient documentation

## 2013-09-05 DIAGNOSIS — Z860100 Personal history of colon polyps, unspecified: Secondary | ICD-10-CM | POA: Insufficient documentation

## 2013-09-05 DIAGNOSIS — Z880 Allergy status to penicillin: Secondary | ICD-10-CM | POA: Insufficient documentation

## 2013-09-05 DIAGNOSIS — R42 Dizziness and giddiness: Secondary | ICD-10-CM | POA: Insufficient documentation

## 2013-09-05 DIAGNOSIS — Z9861 Coronary angioplasty status: Secondary | ICD-10-CM | POA: Insufficient documentation

## 2013-09-05 DIAGNOSIS — R4789 Other speech disturbances: Secondary | ICD-10-CM | POA: Insufficient documentation

## 2013-09-05 DIAGNOSIS — Z87891 Personal history of nicotine dependence: Secondary | ICD-10-CM | POA: Insufficient documentation

## 2013-09-05 DIAGNOSIS — R4781 Slurred speech: Secondary | ICD-10-CM

## 2013-09-05 DIAGNOSIS — Z8639 Personal history of other endocrine, nutritional and metabolic disease: Secondary | ICD-10-CM | POA: Insufficient documentation

## 2013-09-05 DIAGNOSIS — I2510 Atherosclerotic heart disease of native coronary artery without angina pectoris: Secondary | ICD-10-CM | POA: Insufficient documentation

## 2013-09-05 HISTORY — DX: Pain in unspecified knee: M25.569

## 2013-09-05 HISTORY — DX: Unspecified osteoarthritis, unspecified site: M19.90

## 2013-09-05 LAB — URINALYSIS, ROUTINE W REFLEX MICROSCOPIC
BILIRUBIN URINE: NEGATIVE
Glucose, UA: NEGATIVE mg/dL
Hgb urine dipstick: NEGATIVE
Ketones, ur: NEGATIVE mg/dL
Leukocytes, UA: NEGATIVE
NITRITE: NEGATIVE
PROTEIN: NEGATIVE mg/dL
SPECIFIC GRAVITY, URINE: 1.01 (ref 1.005–1.030)
UROBILINOGEN UA: 0.2 mg/dL (ref 0.0–1.0)
pH: 8 (ref 5.0–8.0)

## 2013-09-05 LAB — CBC WITH DIFFERENTIAL/PLATELET
BASOS PCT: 0 % (ref 0–1)
Basophils Absolute: 0 10*3/uL (ref 0.0–0.1)
EOS ABS: 0.2 10*3/uL (ref 0.0–0.7)
EOS PCT: 2 % (ref 0–5)
HCT: 39.3 % (ref 39.0–52.0)
Hemoglobin: 13.3 g/dL (ref 13.0–17.0)
Lymphocytes Relative: 24 % (ref 12–46)
Lymphs Abs: 2.1 10*3/uL (ref 0.7–4.0)
MCH: 30.9 pg (ref 26.0–34.0)
MCHC: 33.8 g/dL (ref 30.0–36.0)
MCV: 91.2 fL (ref 78.0–100.0)
Monocytes Absolute: 0.8 10*3/uL (ref 0.1–1.0)
Monocytes Relative: 9 % (ref 3–12)
NEUTROS PCT: 65 % (ref 43–77)
Neutro Abs: 5.6 10*3/uL (ref 1.7–7.7)
Platelets: 279 10*3/uL (ref 150–400)
RBC: 4.31 MIL/uL (ref 4.22–5.81)
RDW: 12.9 % (ref 11.5–15.5)
WBC: 8.7 10*3/uL (ref 4.0–10.5)

## 2013-09-05 LAB — RAPID URINE DRUG SCREEN, HOSP PERFORMED
Amphetamines: NOT DETECTED
Barbiturates: NOT DETECTED
Benzodiazepines: NOT DETECTED
COCAINE: NOT DETECTED
OPIATES: NOT DETECTED
Tetrahydrocannabinol: NOT DETECTED

## 2013-09-05 LAB — BASIC METABOLIC PANEL
Anion gap: 13 (ref 5–15)
BUN: 24 mg/dL — ABNORMAL HIGH (ref 6–23)
CALCIUM: 9.2 mg/dL (ref 8.4–10.5)
CO2: 23 mEq/L (ref 19–32)
Chloride: 100 mEq/L (ref 96–112)
Creatinine, Ser: 1.25 mg/dL (ref 0.50–1.35)
GFR, EST AFRICAN AMERICAN: 57 mL/min — AB (ref >90)
GFR, EST NON AFRICAN AMERICAN: 50 mL/min — AB (ref >90)
Glucose, Bld: 105 mg/dL — ABNORMAL HIGH (ref 70–99)
POTASSIUM: 4.3 meq/L (ref 3.7–5.3)
SODIUM: 136 meq/L — AB (ref 137–147)

## 2013-09-05 LAB — HEPATIC FUNCTION PANEL
ALT: 12 U/L (ref 0–53)
AST: 17 U/L (ref 0–37)
Albumin: 3.6 g/dL (ref 3.5–5.2)
Alkaline Phosphatase: 55 U/L (ref 39–117)
BILIRUBIN TOTAL: 0.2 mg/dL — AB (ref 0.3–1.2)
Total Protein: 6.8 g/dL (ref 6.0–8.3)

## 2013-09-05 LAB — TROPONIN I

## 2013-09-05 LAB — ETHANOL: Alcohol, Ethyl (B): <11 mg/dL (ref 0–11)

## 2013-09-05 LAB — LACTIC ACID, PLASMA: LACTIC ACID, VENOUS: 1.2 mmol/L (ref 0.5–2.2)

## 2013-09-05 NOTE — ED Notes (Signed)
Pt c/o dizziness upon standing x 5 hours.

## 2013-09-05 NOTE — ED Provider Notes (Signed)
CSN: [id number]     Arrival date & time 09/05/13  1858 History   First MD Initiated Contact with Patient 09/05/13 1902     Chief Complaint  Patient presents with  . Dizziness  . Aphasia     HPI Pt was seen at 1905. Per pt, c/o gradual onset and resolution of multiple intermittent episodes of "lightheadedness," ataxia and slurred speech since yesterday. Pt states he woke up with symptoms today. Describes his lightheadedness as "I feel like I'm going to pass out when I walk." States his gait has been "unsteady." Describes his speech as "slurred" and "like I can't find the words to say." States his symptoms last several hours each episode. Denies focal motor weakness, no tingling/numbness in extremities, no facial droop, no visual changes, no dysphagia, no fevers, no rash, no CP/palpitations, no SOB/cough, no abd pain, no N/V/D.    Past Medical History  Diagnosis Date  . CAD (coronary artery disease)     Stent PCI RCA occlusion 2003.  Cypher  . Colon polyp   . Diverticulosis of colon   . Hyperlipidemia   . Hypertension   . Knee pain   . Arthritis   . Memory impairment    Past Surgical History  Procedure Laterality Date  . Angioplasty    . Appendectomy    . Hernia repair      right inguinal  . Tonsillectomy    . Endarterectomy femoral      History  Substance Use Topics  . Smoking status: Former Games developermoker  . Smokeless tobacco: Not on file     Comment: Quit tobacco 40 years ago.   . Alcohol Use: No    Review of Systems ROS: Statement: All systems negative except as marked or noted in the HPI; Constitutional: Negative for fever and chills. ; ; Eyes: Negative for eye pain, redness and discharge. ; ; ENMT: Negative for ear pain, hoarseness, nasal congestion, sinus pressure and sore throat. ; ; Cardiovascular: Negative for chest pain, palpitations, diaphoresis, dyspnea and peripheral edema. ; ; Respiratory: Negative for cough, wheezing and stridor. ; ; Gastrointestinal: Negative for  nausea, vomiting, diarrhea, abdominal pain, blood in stool, hematemesis, jaundice and rectal bleeding. . ; ; Genitourinary: Negative for dysuria, flank pain and hematuria. ; ; Musculoskeletal: Negative for back pain and neck pain. Negative for swelling and trauma.; ; Skin: Negative for pruritus, rash, abrasions, blisters, bruising and skin lesion.; ; Neuro: +lightheadedness, slurred speech. Negative for headache and neck stiffness. Negative for weakness, altered level of consciousness , altered mental status, extremity weakness, paresthesias, involuntary movement, seizure and syncope.     Allergies  Penicillins and Zocor  Home Medications   Prior to Admission medications   Medication Sig Start Date End Date Taking? Authorizing Provider  Cholecalciferol (VITAMIN D PO) Take 1 capsule by mouth daily.   Yes Historical Provider, MD  lisinopril (PRINIVIL,ZESTRIL) 2.5 MG tablet Take 1 tablet (2.5 mg total) by mouth daily. 07/29/13  Yes Rollene RotundaJames Hochrein, MD  memantine [facility](NAMENDA TITRATION PACK) tablet pack Take 28 mg by mouth daily.   Yes Historical Provider, MD  Memantine HCl ER 28 MG CP24 Take 28 mg by mouth daily. 08/31/13  Yes Ernestina Pennaonald W Moore, MD   BP 174/72  Pulse 78  Resp 24  SpO2 100% Physical Exam 1910: Physical examination:  Nursing notes reviewed; Vital signs and O2 SAT reviewed;  Constitutional: Well developed, Well nourished, Well hydrated, In no acute distress; Head:  Normocephalic, atraumatic; Eyes: EOMI, PERRL, No scleral  icterus; ENMT: Mouth and pharynx normal, Mucous membranes moist; Neck: Supple, Full range of motion, No lymphadenopathy; Cardiovascular: Regular rate and rhythm, No gallop; Respiratory: Breath sounds clear & equal bilaterally, No wheezes.  Speaking full sentences with ease, Normal respiratory effort/excursion; Chest: Nontender, Movement normal; Abdomen: Soft, Nontender, Nondistended, Normal bowel sounds; Genitourinary: No CVA tenderness; Extremities: Pulses normal, No  tenderness, No edema, No calf edema or asymmetry.; Neuro: AA&Ox3, Major CN grossly intact.Speech clear.  No facial droop.  No nystagmus. Grips equal. Strength 5/5 equal bilat UE's and LE's.  DTR 2/4 equal bilat UE's and LE's.  No gross sensory deficits.  Normal cerebellar testing bilat UE's (finger-nose) and LE's (heel-shin). Climbs on and off stretcher easily by himself. Gait steady..; Skin: Color normal, Warm, Dry.; Psych:  Very talkative and animated with ED staff.     ED Course  Procedures     EKG Interpretation   Date/Time:  Saturday September 05 2013 18:58:58 EDT Ventricular Rate:  78 PR Interval:  181 QRS Duration: 137 QT Interval:  448 QTC Calculation: 510 R Axis:   71 Text Interpretation:  Normal sinus rhythm Premature supraventricular  complexes Right bundle branch block Artifact Baseline wander When compared  with ECG of 07/29/2013 No significant change was found Confirmed by  [facility]  MD, Nicholos Johns [phone]) on 09/05/2013 7:23:48 PM      MDM  MDM Reviewed: previous chart, nursing note and vitals Reviewed previous: labs and ECG Interpretation: labs, ECG, x-ray and CT scan    Results for orders placed during the hospital encounter of 09/05/13  URINALYSIS, ROUTINE W REFLEX MICROSCOPIC      Result Value Ref Range   Color, Urine YELLOW  YELLOW   APPearance CLEAR  CLEAR   Specific Gravity, Urine 1.010  1.005 - 1.030   pH 8.0  5.0 - 8.0   Glucose, UA NEGATIVE  NEGATIVE mg/dL   Hgb urine dipstick NEGATIVE  NEGATIVE   Bilirubin Urine NEGATIVE  NEGATIVE   Ketones, ur NEGATIVE  NEGATIVE mg/dL   Protein, ur NEGATIVE  NEGATIVE mg/dL   Urobilinogen, UA 0.2  0.0 - 1.0 mg/dL   Nitrite NEGATIVE  NEGATIVE   Leukocytes, UA NEGATIVE  NEGATIVE  CBC WITH DIFFERENTIAL      Result Value Ref Range   WBC 8.7  4.0 - 10.5 K/uL   RBC 4.31  4.22 - 5.81 MIL/uL   Hemoglobin 13.3  13.0 - 17.0 g/dL   HCT 30.8  65.7 - 84.6 %   MCV 91.2  78.0 - 100.0 fL   MCH 30.9  26.0 - 34.0 pg   MCHC 33.8   30.0 - 36.0 g/dL   RDW 96.2  95.2 - 84.1 %   Platelets 279  150 - 400 K/uL   Neutrophils Relative % 65  43 - 77 %   Neutro Abs 5.6  1.7 - 7.7 K/uL   Lymphocytes Relative 24  12 - 46 %   Lymphs Abs 2.1  0.7 - 4.0 K/uL   Monocytes Relative 9  3 - 12 %   Monocytes Absolute 0.8  0.1 - 1.0 K/uL   Eosinophils Relative 2  0 - 5 %   Eosinophils Absolute 0.2  0.0 - 0.7 K/uL   Basophils Relative 0  0 - 1 %   Basophils Absolute 0.0  0.0 - 0.1 K/uL  BASIC METABOLIC PANEL      Result Value Ref Range   Sodium 136 (*) 137 - 147 mEq/L   Potassium 4.3  3.7 -  5.3 mEq/L   Chloride 100  96 - 112 mEq/L   CO2 23  19 - 32 mEq/L   Glucose, Bld 105 (*) 70 - 99 mg/dL   BUN 24 (*) 6 - 23 mg/dL   Creatinine, Ser 7.251.25  0.50 - 1.35 mg/dL   Calcium 9.2  8.4 - 36.610.5 mg/dL   GFR calc non Af Amer 50 (*) >90 mL/min   GFR calc Af Amer 57 (*) >90 mL/min   Anion gap 13  5 - 15  TROPONIN I      Result Value Ref Range   Troponin I <0.30  <0.30 ng/mL  LACTIC ACID, PLASMA      Result Value Ref Range   Lactic Acid, Venous 1.2  0.5 - 2.2 mmol/L  HEPATIC FUNCTION PANEL      Result Value Ref Range   Total Protein 6.8  6.0 - 8.3 g/dL   Albumin 3.6  3.5 - 5.2 g/dL   AST 17  0 - 37 U/L   ALT 12  0 - 53 U/L   Alkaline Phosphatase 55  39 - 117 U/L   Total Bilirubin 0.2 (*) 0.3 - 1.2 mg/dL   Bilirubin, Direct <4.4<0.2  0.0 - 0.3 mg/dL   Indirect Bilirubin NOT CALCULATED  0.3 - 0.9 mg/dL  ETHANOL      Result Value Ref Range   Alcohol, Ethyl (B) <11  0 - 11 mg/dL  URINE RAPID DRUG SCREEN (HOSP PERFORMED)      Result Value Ref Range   Opiates NONE DETECTED  NONE DETECTED   Cocaine NONE DETECTED  NONE DETECTED   Benzodiazepines NONE DETECTED  NONE DETECTED   Amphetamines NONE DETECTED  NONE DETECTED   Tetrahydrocannabinol NONE DETECTED  NONE DETECTED   Barbiturates NONE DETECTED  NONE DETECTED   Dg Chest 2 View 09/05/2013   CLINICAL DATA:  Dizziness  EXAM: CHEST  2 VIEW  COMPARISON:  05/28/2013  FINDINGS: Chronic  interstitial markings/hyperinflation. Mild scarring in the right mid lung. No focal consolidation. No pleural effusion or pneumothorax.  The heart is normal in size.  Degenerative changes of the visualized thoracolumbar spine.  IMPRESSION: No evidence of acute cardiopulmonary disease.   Electronically Signed   By: Charline BillsSriyesh  Krishnan M.D.   On: 09/05/2013 19:58   Ct Head Wo Contrast 09/05/2013   CLINICAL DATA:  Slurred speech  EXAM: CT HEAD WITHOUT CONTRAST  TECHNIQUE: Contiguous axial images were obtained from the base of the skull through the vertex without intravenous contrast.  COMPARISON:  None.  FINDINGS: The bony calvarium is intact. Mild atrophic changes are seen. No findings to suggest acute hemorrhage, acute infarction or space-occupying mass lesion are noted.  IMPRESSION: Chronic changes without acute abnormality.   Electronically Signed   By: Alcide CleverMark  Lukens M.D.   On: 09/05/2013 19:50    2115:  Pt not orthostatic. Pt ambulated around the ED without symptoms. Pt and family informed re: dx testing results, including my concern for TIA, and that I recommend admission for further evaluation.  Pt refuses admission.  I encouraged pt to stay, continues to refuse.  Pt makes his own medical decisions.  Risks of AMA explained to pt and family, including, but not limited to:  stroke, heart attack, cardiac arrythmia ("irregular heart rate/beat"), "passing out," temporary and/or permanent disability, death.  Pt and family verb understanding and continue to refuse admission, understanding the consequences of their decision.  I encouraged pt to follow up with his PMD on Monday and  return to the ED immediately if symptoms return, or for any other concerns.  Pt and family verb understanding, agreeable.    Laray Anger, DO 09/08/13 1151

## 2013-09-05 NOTE — ED Notes (Signed)
Pt has insisted on leaving AMA.  EDP aware.  Discussed risks involved.  Family and pt verbalized understanding.  Family repots that he will be making an appointment with pt's PCP on Monday.

## 2013-09-05 NOTE — ED Notes (Signed)
Pt ambulated around nurses station with no difficulty. 

## 2013-09-07 LAB — URINE CULTURE
COLONY COUNT: NO GROWTH
Culture: NO GROWTH

## 2013-09-08 ENCOUNTER — Ambulatory Visit (INDEPENDENT_AMBULATORY_CARE_PROVIDER_SITE_OTHER): Payer: Medicare Other | Admitting: Family Medicine

## 2013-09-08 ENCOUNTER — Encounter: Payer: Self-pay | Admitting: Family Medicine

## 2013-09-08 VITALS — BP 154/68 | HR 79 | Temp 97.1°F | Ht 67.0 in | Wt 132.0 lb

## 2013-09-08 DIAGNOSIS — I10 Essential (primary) hypertension: Secondary | ICD-10-CM

## 2013-09-08 DIAGNOSIS — R42 Dizziness and giddiness: Secondary | ICD-10-CM

## 2013-09-08 DIAGNOSIS — R413 Other amnesia: Secondary | ICD-10-CM

## 2013-09-08 NOTE — Progress Notes (Signed)
   Subjective:    Patient ID: Frank Fleming, male    DOB: 07/25/1923, 80 y.o.   MRN: [id number]  HPI Pt is here today for follow up [facility] visit at [facility] on 09/05/2013 due to lightheadedness and weakness. The patient had been started on lisinopril 2-1/2 mg by the cardiologist he felt that this was causing his problem the day that he went to the hospital. He has stopped this medication . The patient does with his son to the visit today. In one breath the patient says he's feeling fine and could feel better. Then in another breath he says he was dizzy when getting up and feels dizzy at times. Since May of this year the patient has been on lisinopril 2.5 mg and he is taking Namenda ask our. The son stopped the lisinopril and the Namenda and says his father has been feeling better since he stopped the. His blood pressure though has been running in the 150s to the 170s over the 70s at home. The son indicates that he may have had some alcohol to drink but the patient denies this. His alcohol level at the emergency room was not detectable.   Review of Systems  Respiratory: Negative for shortness of breath.   Cardiovascular: Negative for chest pain.  Neurological: Positive for weakness and light-headedness (started on 09/05/2013).       Objective:   Physical Exam  Nursing note and vitals reviewed. Constitutional: He is oriented to person, place, and time. He appears well-developed and well-nourished. No distress.  For his age  HENT:  Head: Normocephalic and atraumatic.  Eyes: Conjunctivae and EOM are normal. Pupils are equal, round, and reactive to light. Right eye exhibits no discharge. Left eye exhibits no discharge. No scleral icterus.  Neck: Normal range of motion. Neck supple. No thyromegaly present.  Cardiovascular: Normal rate, regular rhythm, normal heart sounds and intact distal pulses.  Exam reveals no gallop and no friction rub.   No murmur heard. At 96 per minute  Pulmonary/Chest:  Effort normal and breath sounds normal. No respiratory distress. He has no wheezes. He has no rales. He exhibits no tenderness.  Abdominal: Soft. Bowel sounds are normal. He exhibits no mass. There is no tenderness. There is no rebound and no guarding.  Musculoskeletal: Normal range of motion. He exhibits no edema.  Lymphadenopathy:    He has no cervical adenopathy.  Neurological: He is alert and oriented to person, place, and time. He has normal reflexes.  Skin: Skin is warm and dry. No rash noted. No erythema. No pallor.  Psychiatric: He has a normal mood and affect. His behavior is normal. Thought content normal.    BP 154/68  Pulse 79  Temp(Src) 97.1 F (36.2 C) (Oral)  Ht 5\' 7"  (1.702 m)  Wt 132 lb (59.875 kg)  BMI 20.67 kg/m2       Assessment & Plan:  1. Memory impairment  2. Essential hypertension  3. Dizzy spells  Patient Instructions  We will do the 24-hour blood pressure monitor for the next day.  Tomorrow at bedtime start lisinopril 2.5 mg daily Make sure that he drinks plenty of water Avoid alcohol Continue to monitor her blood pressures regularly We will arrange for a visit with the neurologist to further evaluate the dizziness. For now, hold the Arlean HoppingNamenda   Don W. Genesys Coggeshall MD

## 2013-09-08 NOTE — Patient Instructions (Addendum)
We will do the 24-hour blood pressure monitor for the next day.  Tomorrow at bedtime start lisinopril 2.5 mg daily Make sure that he drinks plenty of water Avoid alcohol Continue to monitor her blood pressures regularly We will arrange for a visit with the neurologist to further evaluate the dizziness. For now, hold the [place]amenda

## 2013-09-11 NOTE — Addendum Note (Signed)
Addended by: Bearl MulberryUTHERFORD, NATALIE K on: 09/11/2013 09:15 AM   Modules accepted: Orders

## 2013-09-21 ENCOUNTER — Telehealth: Payer: Self-pay | Admitting: Cardiology

## 2013-09-21 NOTE — Telephone Encounter (Signed)
New message      Returning a nurses call regarding getting lab work

## 2013-09-21 NOTE — Telephone Encounter (Signed)
I have talked to the pt's son and labs were noted in the computer from the July 4, visit in the emergency room, son states his dad is doing much better now

## 2013-09-29 ENCOUNTER — Ambulatory Visit: Payer: Medicare Other | Admitting: Neurology

## 2013-09-29 ENCOUNTER — Telehealth: Payer: Self-pay | Admitting: Neurology

## 2013-09-29 NOTE — Telephone Encounter (Signed)
This patient canceled within several hours of a new patient appointment today.

## 2013-10-05 ENCOUNTER — Telehealth: Payer: Self-pay | Admitting: *Deleted

## 2013-10-05 ENCOUNTER — Ambulatory Visit (INDEPENDENT_AMBULATORY_CARE_PROVIDER_SITE_OTHER): Payer: Medicare Other | Admitting: Neurology

## 2013-10-05 ENCOUNTER — Encounter: Payer: Self-pay | Admitting: *Deleted

## 2013-10-05 ENCOUNTER — Encounter: Payer: Self-pay | Admitting: Neurology

## 2013-10-05 VITALS — BP 176/69 | HR 71 | Ht 65.0 in | Wt 131.0 lb

## 2013-10-05 DIAGNOSIS — R413 Other amnesia: Secondary | ICD-10-CM

## 2013-10-05 DIAGNOSIS — R42 Dizziness and giddiness: Secondary | ICD-10-CM

## 2013-10-05 DIAGNOSIS — D513 Other dietary vitamin B12 deficiency anemia: Secondary | ICD-10-CM

## 2013-10-05 DIAGNOSIS — R6889 Other general symptoms and signs: Secondary | ICD-10-CM

## 2013-10-05 DIAGNOSIS — D518 Other vitamin B12 deficiency anemias: Secondary | ICD-10-CM

## 2013-10-05 HISTORY — DX: Dizziness and giddiness: R42

## 2013-10-05 MED ORDER — MEMANTINE HCL 28 X 5 MG & 21 X 10 MG PO TABS: ORAL_TABLET | ORAL | Status: DC

## 2013-10-05 NOTE — Telephone Encounter (Signed)
Message copied by Gwenith DailyHUDY, Deztinee Lohmeyer N on Mon Oct 05, 2013  1:20 PM ------      Message from: Ernestina PennaMOORE, DONALD W      Created: Mon Oct 05, 2013  1:12 PM       Please call patient's son. If lip swelling continues or happens again, he should discontinue the lisinopril and call us.      ----- Message -----         From: York Spanielharles K Willis, MD         Sent: 10/05/2013   9:52 AM           To: Ernestina Pennaonald W Moore, MD            I saw Mr. Alessandra BevelsVaughn today. He apparently had an episode yesterday morning of significant upper lip swelling that was painless. No involvement of the tongue. He is on lisinopril, so I wanted to let you know about this event. He since has taken another lisinopril tablet last night, with no problems this morning.       ------

## 2013-10-05 NOTE — Patient Instructions (Signed)

## 2013-10-05 NOTE — Telephone Encounter (Signed)
Discussed with patient. He denies recurrence of swelling and will let us know if it happens again.

## 2013-10-05 NOTE — Progress Notes (Signed)
Reason for visit: Dizziness, memory disturbance  Frank Fleming is a 80 y.o. male  History of present illness:  Frank Fleming is an 66 right-handed white male with a history of a progressive memory disturbance that dates back about 2 years. The patient lives alone, but his family lives quite near him. The patient still operates a motor vehicle, and he still pays his bills. He will occasionally lose a bill. The patient requires some assistance keeping up with his medications and appointments. He otherwise performs all of his own activities of daily living. He has some gait instability, particularly when going down stairs. He does not use a cane for ambulation. He denies any numbness or weakness of the face, arms, or legs. He was seen in the emergency room on 09/05/2013 with an episode of dizziness that he describes as a true vertigo sensation. He indicates that the sensation lasted about 2 hours, unassociated with headache. There is some question about slurred speech, but the son is not sure that this really was a problem. The patient underwent a CT scan of the brain in the emergency room that was relatively unremarkable, without acute changes. The patient has not had any symptoms since that time. The family was concerned that the Namenda was the etiology of the dizziness, and this was stopped. The patient had almost completed a one-month titration pack of Namenda. He has not had any recurrent dizziness events previously or since. He comes to this office for an evaluation.  Past Medical History  Diagnosis Date  . CAD (coronary artery disease)     Stent PCI RCA occlusion 2003.  Cypher  . Colon polyp   . Diverticulosis of colon   . Hyperlipidemia   . Hypertension   . Knee pain   . Arthritis   . Memory impairment   . Dizziness and giddiness 10/05/2013    Past Surgical History  Procedure Laterality Date  . Angioplasty    . Appendectomy    . Hernia repair      right inguinal  . Tonsillectomy     . Endarterectomy femoral      Family History  Problem Relation Age of Onset  . Leukemia Sister     Social history:  reports that he has quit smoking. His smoking use included Cigarettes. He has a 15 pack-year smoking history. He has quit using smokeless tobacco. His smokeless tobacco use included Chew. He reports that he does not drink alcohol or use illicit drugs.  Medications:  Current Outpatient Prescriptions on File Prior to Visit  Medication Sig Dispense Refill  . Cholecalciferol (VITAMIN D PO) Take 1 capsule by mouth daily.      Marland Kitchen lisinopril (PRINIVIL,ZESTRIL) 2.5 MG tablet Take 1 tablet (2.5 mg total) by mouth daily.  30 tablet  6  . Memantine HCl ER 28 MG CP24 Take 28 mg by mouth daily.  30 capsule  2   No current facility-administered medications on file prior to visit.      Allergies  Allergen Reactions  . Penicillins     Unknown-to patient and family   . Zocor [Simvastatin]     unknown    ROS:  Out of a complete 14 system review of symptoms, the patient complains only of the following symptoms, and all other reviewed systems are negative.  Memory disturbance Dizziness  Blood pressure 176/69, pulse 71, height 5\' 5"  (1.651 m), weight 131 lb (59.421 kg).  Physical Exam  General: The patient is alert and  cooperative at the time of the examination.  Eyes: Pupils are equal, round, and reactive to light. Discs are flat bilaterally.  Neck: The neck is supple, no carotid bruits are noted.  Respiratory: The respiratory examination is clear.  Cardiovascular: The cardiovascular examination reveals a regular rate and rhythm, no obvious murmurs or rubs are noted.  Skin: Extremities are without significant edema.  Neurologic Exam  Mental status: The Mini-Mental status examination done today shows a total score 22/30.  Cranial nerves: Facial symmetry is present. There is good sensation of the face to pinprick and soft touch bilaterally. The strength of the  facial muscles and the muscles to head turning and shoulder shrug are normal bilaterally. Speech is well enunciated, no aphasia or dysarthria is noted. Extraocular movements are full. Visual fields are full. The tongue is midline, and the patient has symmetric elevation of the soft palate. No obvious hearing deficits are noted.  Motor: The motor testing reveals 5 over 5 strength of all 4 extremities. Good symmetric motor tone is noted throughout.  Sensory: Sensory testing is intact to pinprick, soft touch, vibration sensation, and position sense on all 4 extremities, with the exception that there is a decrease in position sense in both feet. No evidence of extinction is noted.  Coordination: Cerebellar testing reveals good finger-nose-finger and heel-to-shin bilaterally.  Gait and station: Gait is  Slightly wide-based, slightly unsteady. Tandem gait is unsteady. Romberg is negative. No drift is seen.  Reflexes: Deep tendon reflexes are symmetric and normal bilaterally. Toes are downgoing bilaterally.   CT head 09/05/13:  IMPRESSION:  Chronic changes without acute abnormality.     Assessment/Plan:  1. Progressive memory disturbance  2. Dizziness episode  The patient appears to be doing relatively well at this point. It is possible that Namenda may have been the etiology of the dizziness, but the patient had almost completed a one-month course of Namenda without any problems with dizziness. The patient will be set up for MRI evaluation of the brain looking for cerebrovascular disease. He will undergo some blood work today. I will give him a retrial on the Namenda, but we will need to start back on the titration pack. The patient will followup in about 4 months.  Frank Palau. Keith Willis MD 10/05/2013 8:17 PM  [facility] [address] [place], [place] [id number]  Phone [phone] Fax [phone]

## 2013-10-06 ENCOUNTER — Telehealth: Payer: Self-pay | Admitting: Family Medicine

## 2013-10-06 LAB — VITAMIN B12: VITAMIN B 12: 685 pg/mL (ref 211–946)

## 2013-10-06 LAB — TSH: TSH: 1.94 u[IU]/mL (ref 0.450–4.500)

## 2013-10-06 LAB — RPR: RPR: NONREACTIVE

## 2013-10-06 NOTE — Telephone Encounter (Signed)
Pt son aware and titration pack given

## 2013-10-06 NOTE — Telephone Encounter (Signed)
Yes restart the medication for memory. He'll have to do another starter pack.

## 2013-11-03 ENCOUNTER — Telehealth: Payer: Self-pay | Admitting: Family Medicine

## 2013-11-03 ENCOUNTER — Ambulatory Visit (INDEPENDENT_AMBULATORY_CARE_PROVIDER_SITE_OTHER): Payer: Medicare Other | Admitting: Family Medicine

## 2013-11-03 ENCOUNTER — Encounter: Payer: Self-pay | Admitting: Family Medicine

## 2013-11-03 VITALS — BP 158/70 | HR 78 | Temp 96.7°F | Ht 67.0 in | Wt 133.0 lb

## 2013-11-03 DIAGNOSIS — I10 Essential (primary) hypertension: Secondary | ICD-10-CM

## 2013-11-03 DIAGNOSIS — E7800 Pure hypercholesterolemia, unspecified: Secondary | ICD-10-CM

## 2013-11-03 DIAGNOSIS — R42 Dizziness and giddiness: Secondary | ICD-10-CM

## 2013-11-03 DIAGNOSIS — I2510 Atherosclerotic heart disease of native coronary artery without angina pectoris: Secondary | ICD-10-CM

## 2013-11-03 DIAGNOSIS — R413 Other amnesia: Secondary | ICD-10-CM

## 2013-11-03 DIAGNOSIS — I25111 Atherosclerotic heart disease of native coronary artery with angina pectoris with documented spasm: Secondary | ICD-10-CM

## 2013-11-03 DIAGNOSIS — I209 Angina pectoris, unspecified: Secondary | ICD-10-CM

## 2013-11-03 NOTE — Patient Instructions (Signed)
Continue to be careful and did not put yourself at risk for falling Use a walker or cane beside of your bed or chair Remove all of the loose throw rugs around the house

## 2013-11-03 NOTE — Progress Notes (Signed)
Subjective:    Patient ID: Frank Fleming, male    DOB: June 08, 1923, 80 y.o.   MRN: [id number]  HPI Patient here today for light-headedness. This seems to have gotten worse since started the 28 mg of namenda XR. The patient comes to the visit today with his son. They do not have any blood pressure readings to bring with them to the visit today.       Patient Active Problem List   Diagnosis Date Noted  . Dizziness and giddiness 10/05/2013  . Memory impairment 05/28/2013  . HYPERCHOLESTEROLEMIA 11/14/2009  . UNSPECIFIED ESSENTIAL HYPERTENSION 11/14/2009  . CORONARY ATHEROSCLEROSIS NATIVE CORONARY ARTERY 11/14/2009  . PVD 11/14/2009   Outpatient Encounter Prescriptions as of 11/03/2013  Medication Sig  . Cholecalciferol (VITAMIN D PO) Take 1 capsule by mouth daily.  Marland Kitchen lisinopril (PRINIVIL,ZESTRIL) 2.5 MG tablet Take 1 tablet (2.5 mg total) by mouth daily.  . Memantine HCl ER 28 MG CP24 Take 28 mg by mouth daily.  . [DISCONTINUED] memantine (NAMENDA TITRATION PAK) tablet pack 5 mg/day for =1 week; 5 mg twice daily for =1 week; 15 mg/day given in 5 mg and 10 mg separated doses for =1 week; then 10 mg twice daily    Review of Systems  Constitutional: Negative.   HENT: Negative.   Eyes: Negative.   Respiratory: Negative.   Cardiovascular: Negative.   Gastrointestinal: Negative.   Endocrine: Negative.   Genitourinary: Negative.   Musculoskeletal: Negative.   Skin: Negative.   Allergic/Immunologic: Negative.   Neurological: Positive for dizziness and light-headedness.  Hematological: Negative.   Psychiatric/Behavioral: Negative.        Objective:   Physical Exam  Nursing note and vitals reviewed. Constitutional: He is oriented to person, place, and time. He appears well-developed and well-nourished. No distress.  Definite memory impairment and not knowing who I am. And he does not remember his family tree information   HENT:  Head: Normocephalic and atraumatic.  Eyes:  Conjunctivae and EOM are normal. Pupils are equal, round, and reactive to light. Right eye exhibits no discharge. Left eye exhibits no discharge. No scleral icterus.  Neck: Normal range of motion. Neck supple. No thyromegaly present.  Cardiovascular: Normal rate, regular rhythm and normal heart sounds.   No murmur heard. Pulmonary/Chest: Effort normal and breath sounds normal. He has no wheezes. He has no rales. He exhibits no tenderness.  Abdominal: Soft. He exhibits no distension. There is no tenderness. There is no rebound and no guarding.  Musculoskeletal: He exhibits no edema.  He has gait instability  Lymphadenopathy:    He has no cervical adenopathy.  Neurological: He is alert and oriented to person, place, and time.  The patient definitely has impaired memory  Skin: Skin is warm and dry. No rash noted.  Psychiatric: He has a normal mood and affect. His behavior is normal.   BP 158/77  Pulse 78  Temp(Src) 96.7 F (35.9 C) (Oral)  Ht  (1.702 m)  Wt 133 lb (60.328 kg)  BMI 20.83 kg/m2        Assessment & Plan:  1. Atherosclerosis of native coronary artery of native heart with angina pectoris with documented spasm 2. HYPERCHOLESTEROLEMIA  3. Unspecified essential hypertension  4. Memory impairment  5. Dizzy spells  Patient Instructions  Continue to be careful and did not put yourself at risk for falling Use a walker or cane beside of your bed or chair Remove all of the loose throw rugs around the house  Discontinue Namenda   Nyra Capes MD

## 2013-11-03 NOTE — Telephone Encounter (Signed)
appt to be arranged with [facility]

## 2013-11-17 ENCOUNTER — Ambulatory Visit: Payer: Medicare Other | Admitting: *Deleted

## 2013-11-17 VITALS — BP 142/72 | HR 68

## 2013-11-17 DIAGNOSIS — I10 Essential (primary) hypertension: Secondary | ICD-10-CM

## 2013-11-17 NOTE — Progress Notes (Signed)
Patient ID: Frank Fleming, male   DOB: 11/17/1923, 80 y.o.   MRN: [id number] Pt here with episode of dizziness this AM Denies nausea or headache Better now Will come in for appt if symptoms persist or worsen

## 2014-01-20 ENCOUNTER — Encounter: Payer: Self-pay | Admitting: Neurology

## 2014-01-26 ENCOUNTER — Encounter: Payer: Self-pay | Admitting: Neurology

## 2014-02-04 ENCOUNTER — Ambulatory Visit: Payer: Medicare Other | Admitting: Adult Health

## 2014-02-08 ENCOUNTER — Ambulatory Visit: Payer: Medicare Other | Admitting: Adult Health

## 2014-02-09 ENCOUNTER — Encounter: Payer: Self-pay | Admitting: *Deleted

## 2014-03-02 ENCOUNTER — Ambulatory Visit (INDEPENDENT_AMBULATORY_CARE_PROVIDER_SITE_OTHER): Payer: Medicare Other | Admitting: Family Medicine

## 2014-03-02 ENCOUNTER — Encounter: Payer: Self-pay | Admitting: Family Medicine

## 2014-03-02 VITALS — BP 167/83 | HR 90 | Temp 96.8°F | Ht 67.0 in

## 2014-03-02 DIAGNOSIS — E7800 Pure hypercholesterolemia, unspecified: Secondary | ICD-10-CM

## 2014-03-02 DIAGNOSIS — Z23 Encounter for immunization: Secondary | ICD-10-CM

## 2014-03-02 DIAGNOSIS — F03A0 Unspecified dementia, mild, without behavioral disturbance, psychotic disturbance, mood disturbance, and anxiety: Secondary | ICD-10-CM

## 2014-03-02 DIAGNOSIS — I2510 Atherosclerotic heart disease of native coronary artery without angina pectoris: Secondary | ICD-10-CM

## 2014-03-02 DIAGNOSIS — I10 Essential (primary) hypertension: Secondary | ICD-10-CM

## 2014-03-02 DIAGNOSIS — I25111 Atherosclerotic heart disease of native coronary artery with angina pectoris with documented spasm: Secondary | ICD-10-CM

## 2014-03-02 DIAGNOSIS — E559 Vitamin D deficiency, unspecified: Secondary | ICD-10-CM

## 2014-03-02 DIAGNOSIS — F0390 Unspecified dementia without behavioral disturbance: Secondary | ICD-10-CM

## 2014-03-02 DIAGNOSIS — N400 Enlarged prostate without lower urinary tract symptoms: Secondary | ICD-10-CM

## 2014-03-02 LAB — POCT CBC
Granulocyte percent: 68.5 %G (ref 37–80)
HCT, POC: 41.3 % — AB (ref 43.5–53.7)
HEMOGLOBIN: 13 g/dL — AB (ref 14.1–18.1)
LYMPH, POC: 2 (ref 0.6–3.4)
MCH, POC: 28.5 pg (ref 27–31.2)
MCHC: 31.5 g/dL — AB (ref 31.8–35.4)
MCV: 90.4 fL (ref 80–97)
MPV: 8.5 fL (ref 0–99.8)
PLATELET COUNT, POC: 280 10*3/uL (ref 142–424)
POC Granulocyte: 5.8 (ref 2–6.9)
POC LYMPH %: 24.4 % (ref 10–50)
RBC: 4.6 M/uL — AB (ref 4.69–6.13)
RDW, POC: 14.3 %
WBC: 8.4 10*3/uL (ref 4.6–10.2)

## 2014-03-02 MED ORDER — LISINOPRIL 2.5 MG PO TABS: 2.5000 mg | ORAL_TABLET | Freq: Every day | ORAL | Status: DC

## 2014-03-02 NOTE — Progress Notes (Signed)
Subjective:    Patient ID: Frank Fleming, male    DOB: 1923-03-23, 80 y.o.   MRN: [id number]  HPI Pt here for follow up and management of chronic medical problems. The patient comes to the visit today with his son. He is due to get lab work flu shot Prevnar vaccine and a rectal exam. We will also give him an FOBT to return. He needs a refill on his lisinopril. Blood Pressures Have Been Running in the 140s over the 70-80 Range.        Patient Active Problem List   Diagnosis Date Noted  . Dizziness and giddiness 10/05/2013  . Memory impairment 05/28/2013  . HYPERCHOLESTEROLEMIA 11/14/2009  . UNSPECIFIED ESSENTIAL HYPERTENSION 11/14/2009  . CORONARY ATHEROSCLEROSIS NATIVE CORONARY ARTERY 11/14/2009  . PVD 11/14/2009   Outpatient Encounter Prescriptions as of 03/02/2014  Medication Sig  . aspirin 81 MG tablet Take 81 mg by mouth daily.  . Cholecalciferol (VITAMIN D PO) Take 1 capsule by mouth daily.  Marland Kitchen lisinopril (PRINIVIL,ZESTRIL) 2.5 MG tablet Take 1 tablet (2.5 mg total) by mouth daily.  . [DISCONTINUED] Memantine HCl ER 28 MG CP24 Take 28 mg by mouth daily.    Review of Systems  Constitutional: Negative.   HENT: Negative.   Respiratory: Negative.   Cardiovascular: Negative.   Gastrointestinal: Negative.   Endocrine: Negative.   Genitourinary: Negative.   Musculoskeletal: Negative.   Skin: Negative.   Allergic/Immunologic: Negative.   Neurological: Negative.   Hematological: Negative.   Psychiatric/Behavioral: Negative.        Objective:   Physical Exam  Constitutional: He is oriented to person, place, and time. He appears well-developed and well-nourished. No distress.  The patient looks much younger than his stated age of 89 years  HENT:  Head: Normocephalic and atraumatic.  Right Ear: External ear normal.  Left Ear: External ear normal.  Nose: Nose normal.  Mouth/Throat: Oropharynx is clear and moist. No oropharyngeal exudate.  Eyes: Conjunctivae and EOM  are normal. Pupils are equal, round, and reactive to light. Right eye exhibits no discharge. Left eye exhibits no discharge. No scleral icterus.  Neck: Normal range of motion. Neck supple. No thyromegaly present.  No adenopathy or carotid bruits  Cardiovascular: Normal rate, regular rhythm, normal heart sounds and intact distal pulses.  Exam reveals no gallop and no friction rub.   No murmur heard. At 84/m  Pulmonary/Chest: Effort normal and breath sounds normal. No respiratory distress. He has no wheezes. He has no rales. He exhibits no tenderness.  Lungs are clear anteriorly and posteriorly. No axillary adenopathy  Abdominal: Soft. Bowel sounds are normal. He exhibits no mass. There is no tenderness. There is no rebound and no guarding.  No inguinal adenopathy  Genitourinary: Rectum normal and penis normal.  The prostate was enlarged and firm. There were no rectal masses. There were no inguinal hernias. The external genitalia were normal.  Musculoskeletal: Normal range of motion. He exhibits no edema or tenderness.  Lymphadenopathy:    He has no cervical adenopathy.  Neurological: He is alert and oriented to person, place, and time. He has normal reflexes. No cranial nerve deficit.  Skin: Skin is warm and dry. No rash noted. No erythema. No pallor.  Psychiatric: He has a normal mood and affect. His behavior is normal. Judgment and thought content normal.  Nursing note and vitals reviewed.  BP 167/83 mmHg  Pulse 90  Temp(Src) 96.8 F (36 C) (Oral)  Ht 5' 7"  (1.702 m)  Assessment & Plan:  1. Essential hypertension - POCT CBC - BMP8+EGFR - Hepatic function panel  2. HYPERCHOLESTEROLEMIA - POCT CBC - Lipid panel  3. Vitamin D deficiency - POCT CBC - Vit D  25 hydroxy (rtn osteoporosis monitoring)  4. ASCVD (arteriosclerotic cardiovascular disease) - POCT CBC  5. Atherosclerosis of native coronary artery of native heart with angina pectoris with documented spasm -  POCT CBC  6. Mild dementia  7. BPH (benign prostatic hyperplasia)  Meds ordered this encounter  Medications  . aspirin 81 MG tablet    Sig: Take 81 mg by mouth daily.  Marland Kitchen lisinopril (PRINIVIL,ZESTRIL) 2.5 MG tablet    Sig: Take 1 tablet (2.5 mg total) by mouth daily.    Dispense:  30 tablet    Refill:  6   Patient Instructions                       Medicare Annual Wellness Visit  [facility] and the medical providers at [facility] strive to bring you the best medical care.  In doing so we not only want to address your current medical conditions and concerns but also to detect new conditions early and prevent illness, disease and health-related problems.    Medicare offers a yearly Wellness Visit which allows our clinical staff to assess your need for preventative services including immunizations, lifestyle education, counseling to decrease risk of preventable diseases and screening for fall risk and other medical concerns.    This visit is provided free of charge (no copay) for all Medicare recipients. The clinical pharmacists at [facility] have begun to conduct these Wellness Visits which will also include a thorough review of all your medications.    As you primary medical provider recommend that you make an appointment for your Annual Wellness Visit if you have not done so already this year.  You may set up this appointment before you leave today or you may call back [phone]) and schedule an appointment.  Please make sure when you call that you mention that you are scheduling your Annual Wellness Visit with the clinical pharmacist so that the appointment may be made for the proper length of time.      Continue current medications. Continue good therapeutic lifestyle changes which include good diet and exercise. Fall precautions discussed with patient. If an FOBT was given today- please return it to our front desk. If you are over 101  years old - you may need Prevnar 58 or the adult Pneumonia vaccine.  Flu Shots will be available at our office starting mid- September. Please call and schedule a FLU CLINIC APPOINTMENT.   The patient should continue to be careful and not fall He should use his walker or cane when he is at home. He needs to drink plenty of fluids Continue to take current medications If the callus continues on the bottom of his foot or gives him pain please schedule a visit with the podiatrist or come back and we will trim the callus down   Arrie Senate MD

## 2014-03-02 NOTE — Patient Instructions (Addendum)
Medicare Annual Wellness Visit  [facility] and the medical providers at [facility] strive to bring you the best medical care.  In doing so we not only want to address your current medical conditions and concerns but also to detect new conditions early and prevent illness, disease and health-related problems.    Medicare offers a yearly Wellness Visit which allows our clinical staff to assess your need for preventative services including immunizations, lifestyle education, counseling to decrease risk of preventable diseases and screening for fall risk and other medical concerns.    This visit is provided free of charge (no copay) for all Medicare recipients. The clinical pharmacists at [facility] have begun to conduct these Wellness Visits which will also include a thorough review of all your medications.    As you primary medical provider recommend that you make an appointment for your Annual Wellness Visit if you have not done so already this year.  You may set up this appointment before you leave today or you may call back [phone] and schedule an appointment.  Please make sure when you call that you mention that you are scheduling your Annual Wellness Visit with the clinical pharmacist so that the appointment may be made for the proper length of time.      Continue current medications. Continue good therapeutic lifestyle changes which include good diet and exercise. Fall precautions discussed with patient. If an FOBT was given today- please return it to our front desk. If you are over 78 years old - you may need Prevnar 13 or the adult Pneumonia vaccine.  Flu Shots will be available at our office starting mid- September. Please call and schedule a FLU CLINIC APPOINTMENT.   The patient should continue to be careful and not fall He should use his walker or cane when he is at home. He needs to drink plenty of  fluids Continue to take current medications If the callus continues on the bottom of his foot or gives him pain please schedule a visit with the podiatrist or come back and we will trim the callus down

## 2014-03-03 ENCOUNTER — Other Ambulatory Visit: Payer: Self-pay | Admitting: *Deleted

## 2014-03-03 LAB — BMP8+EGFR
BUN / CREAT RATIO: 20 (ref 10–22)
BUN: 23 mg/dL (ref 8–27)
CALCIUM: 9.1 mg/dL (ref 8.6–10.2)
CO2: 22 mmol/L (ref 18–29)
Chloride: 101 mmol/L (ref 97–108)
Creatinine, Ser: 1.14 mg/dL (ref 0.76–1.27)
GFR calc Af Amer: 66 mL/min/{1.73_m2} (ref >59)
GFR calc non Af Amer: 57 mL/min/{1.73_m2} — ABNORMAL LOW (ref >59)
Glucose: 97 mg/dL (ref 65–99)
POTASSIUM: 4.5 mmol/L (ref 3.5–5.2)
SODIUM: 138 mmol/L (ref 134–144)

## 2014-03-03 LAB — HEPATIC FUNCTION PANEL
ALBUMIN: 4 g/dL (ref 3.5–4.7)
ALT: 11 IU/L (ref 0–44)
AST: 13 IU/L (ref 0–40)
Alkaline Phosphatase: 62 IU/L (ref 39–117)
Bilirubin, Direct: 0.08 mg/dL (ref 0.00–0.40)
Total Bilirubin: 0.3 mg/dL (ref 0.0–1.2)
Total Protein: 6.2 g/dL (ref 6.0–8.5)

## 2014-03-03 LAB — LIPID PANEL
CHOL/HDL RATIO: 3.9 ratio (ref 0.0–5.0)
Cholesterol, Total: 228 mg/dL — ABNORMAL HIGH (ref 100–199)
HDL: 59 mg/dL (ref >39)
LDL CALC: 133 mg/dL — AB (ref 0–99)
TRIGLYCERIDES: 181 mg/dL — AB (ref 0–149)
VLDL Cholesterol Cal: 36 mg/dL (ref 5–40)

## 2014-03-03 LAB — VITAMIN D 25 HYDROXY (VIT D DEFICIENCY, FRACTURES): VIT D 25 HYDROXY: 44.2 ng/mL (ref 30.0–100.0)

## 2014-07-13 ENCOUNTER — Encounter: Payer: Self-pay | Admitting: Family Medicine

## 2014-07-13 ENCOUNTER — Ambulatory Visit (INDEPENDENT_AMBULATORY_CARE_PROVIDER_SITE_OTHER): Payer: Medicare Other | Admitting: Family Medicine

## 2014-07-13 VITALS — BP 158/90 | HR 75 | Temp 97.2°F | Ht 67.0 in | Wt 134.0 lb

## 2014-07-13 DIAGNOSIS — R5383 Other fatigue: Secondary | ICD-10-CM | POA: Diagnosis not present

## 2014-07-13 DIAGNOSIS — R413 Other amnesia: Secondary | ICD-10-CM

## 2014-07-13 DIAGNOSIS — I25111 Atherosclerotic heart disease of native coronary artery with angina pectoris with documented spasm: Secondary | ICD-10-CM | POA: Diagnosis not present

## 2014-07-13 DIAGNOSIS — I10 Essential (primary) hypertension: Secondary | ICD-10-CM | POA: Diagnosis not present

## 2014-07-13 DIAGNOSIS — E7800 Pure hypercholesterolemia, unspecified: Secondary | ICD-10-CM

## 2014-07-13 DIAGNOSIS — S4992XA Unspecified injury of left shoulder and upper arm, initial encounter: Secondary | ICD-10-CM

## 2014-07-13 DIAGNOSIS — E559 Vitamin D deficiency, unspecified: Secondary | ICD-10-CM

## 2014-07-13 DIAGNOSIS — I2510 Atherosclerotic heart disease of native coronary artery without angina pectoris: Secondary | ICD-10-CM | POA: Diagnosis not present

## 2014-07-13 LAB — POCT CBC
GRANULOCYTE PERCENT: 62.2 % (ref 37–80)
HEMATOCRIT: 36.8 % — AB (ref 43.5–53.7)
Hemoglobin: 11.5 g/dL — AB (ref 14.1–18.1)
Lymph, poc: 2.3 (ref 0.6–3.4)
MCH: 28.1 pg (ref 27–31.2)
MCHC: 31.2 g/dL — AB (ref 31.8–35.4)
MCV: 89.9 fL (ref 80–97)
MPV: 8.7 fL (ref 0–99.8)
POC Granulocyte: 4.7 (ref 2–6.9)
POC LYMPH PERCENT: 30.6 %L (ref 10–50)
Platelet Count, POC: 277 10*3/uL (ref 142–424)
RBC: 4.09 M/uL — AB (ref 4.69–6.13)
RDW, POC: 13.6 %
WBC: 7.5 10*3/uL (ref 4.6–10.2)

## 2014-07-13 NOTE — Patient Instructions (Addendum)
Medicare Annual Wellness Visit  [facility] and the medical providers at [facility] strive to bring you the best medical care.  In doing so we not only want to address your current medical conditions and concerns but also to detect new conditions early and prevent illness, disease and health-related problems.    Medicare offers a yearly Wellness Visit which allows our clinical staff to assess your need for preventative services including immunizations, lifestyle education, counseling to decrease risk of preventable diseases and screening for fall risk and other medical concerns.    This visit is provided free of charge (no copay) for all Medicare recipients. The clinical pharmacists at [facility] have begun to conduct these Wellness Visits which will also include a thorough review of all your medications.    As you primary medical provider recommend that you make an appointment for your Annual Wellness Visit if you have not done so already this year.  You may set up this appointment before you leave today or you may call back [phone] and schedule an appointment.  Please make sure when you call that you mention that you are scheduling your Annual Wellness Visit with the clinical pharmacist so that the appointment may be made for the proper length of time.     Continue current medications. Continue good therapeutic lifestyle changes which include good diet and exercise. Fall precautions discussed with patient. If an FOBT was given today- please return it to our front desk. If you are over 558 years old - you may need Prevnar 13 or the adult Pneumonia vaccine.  Flu Shots are still available at our office. If you still haven't had one please call to set up a nurse visit to get one.   After your visit with us today you will receive a survey in the mail or online from [employer] regarding your care with us. Please take a moment to  fill this out. Your feedback is very important to us as you can help us better understand your patient needs as well as improve your experience and satisfaction. WE CARE ABOUT YOU!!!   Drink plenty of water Be careful and don't put herself at risk for falls Call you son if you need to  We will call you with your lab results once they are back.  If the wound on the arm does not continue to improve or gets more red or inflamed the patient should return to the office for further recheck Bring blood pressures by for review and a couple of weeks

## 2014-07-13 NOTE — Progress Notes (Signed)
Subjective:    Patient ID: Frank Fleming, male    DOB: 1923/06/28, 81 y.o.   MRN: [id number]  HPI Pt here for follow up and management of chronic medical problems which includes hypertension and hyperlipidemia. He is taking medications regularly. The patient comes with his son Toni Arthurs. His review of systems is positive for confusion and chills. The patient lives by himself but his son lives in a house literally right in the same yard behind his father. He says his father eats regularly and remains safe in the home with turning off appliances etc. The father calls his son frequently. The son is there every day to check on him. He is definitely somewhat forgetful but is pretty sharp to remember and respond appropriately to questions asked of him. He denies chest pain shortness of breath GI symptoms or trouble passing his water. He did hit his left arm against something in the bathroom and has a wound that appears to be healing well with minimal redness.      Patient Active Problem List   Diagnosis Date Noted  . Dizziness and giddiness 10/05/2013  . Memory impairment 05/28/2013  . HYPERCHOLESTEROLEMIA 11/14/2009  . UNSPECIFIED ESSENTIAL HYPERTENSION 11/14/2009  . CORONARY ATHEROSCLEROSIS NATIVE CORONARY ARTERY 11/14/2009  . PVD 11/14/2009   Outpatient Encounter Prescriptions as of 07/13/2014  Medication Sig  . aspirin 81 MG tablet Take 81 mg by mouth daily.  . Cholecalciferol (VITAMIN D PO) Take 1 capsule by mouth daily.  Marland Kitchen lisinopril (PRINIVIL,ZESTRIL) 2.5 MG tablet Take 1 tablet (2.5 mg total) by mouth daily.   No facility-administered encounter medications on file as of 07/13/2014.      Review of Systems  Constitutional: Positive for chills.  HENT: Negative.   Eyes: Negative.   Respiratory: Negative.   Cardiovascular: Negative.   Gastrointestinal: Negative.   Endocrine: Negative.   Genitourinary: Negative.   Musculoskeletal: Negative.   Skin: Negative.   Allergic/Immunologic:  Negative.   Neurological: Negative.   Hematological: Negative.   Psychiatric/Behavioral: Positive for confusion.       Objective:   Physical Exam  Constitutional: He is oriented to person, place, and time. He appears well-developed and well-nourished. No distress.  For his age of 72 years  HENT:  Head: Normocephalic and atraumatic.  Right Ear: External ear normal.  Left Ear: External ear normal.  Nose: Nose normal.  Mouth/Throat: Oropharynx is clear and moist. No oropharyngeal exudate.  Eyes: Conjunctivae and EOM are normal. Pupils are equal, round, and reactive to light. Right eye exhibits no discharge. Left eye exhibits no discharge. No scleral icterus.  Neck: Normal range of motion. Neck supple. No tracheal deviation present. No thyromegaly present.  No carotid bruits or anterior cervical adenopathy  Cardiovascular: Normal rate, regular rhythm and normal heart sounds.  Exam reveals no friction rub.   No murmur heard. Pedal pulses were slightly decreased bilaterally The rhythm is regular at 72/m  Pulmonary/Chest: Effort normal and breath sounds normal. No respiratory distress. He has no wheezes. He has no rales. He exhibits no tenderness.  No axillary adenopathy  Abdominal: Soft. Bowel sounds are normal. He exhibits no mass. There is no tenderness. There is no rebound and no guarding.  No inguinal adenopathy. There was no tenderness. The abdomen was somewhat tympanic but without bruits.  Musculoskeletal: Normal range of motion. He exhibits no edema or tenderness.  Lymphadenopathy:    He has no cervical adenopathy.  Neurological: He is alert and oriented to person, place, and time.  He has normal reflexes. No cranial nerve deficit.  Skin: Skin is warm and dry. No rash noted. No erythema. No pallor.  Is a healing wound of the left upper arm  Psychiatric: He has a normal mood and affect. His behavior is normal. Judgment normal.  The best way to some up this patient is he's a happy  person was to stay independent and has memory deficits and does not want to be taken care of by anybody he wants to be as independent as possible for as long as possible  Nursing note and vitals reviewed.  BP 158/90 mmHg  Pulse 75  Temp(Src) 97.2 F (36.2 C) (Oral)  Ht 5' 7"  (1.702 m)  Wt 134 lb (60.782 kg)  BMI 20.98 kg/m2        Assessment & Plan:  1. Essential hypertension -The blood pressure is elevated today. The son will bring this back some readings within a couple weeks. We have increased his lisinopril in the past and in doing this he got very lightheaded and having increased risk for falling. We therefore will review the readings and make decisions about better blood pressure control at that time. - POCT CBC - BMP8+EGFR - Hepatic function panel  2. HYPERCHOLESTEROLEMIA -The patient will continue to watch his diet closely and will do his in home exercises as he is doing now. - POCT CBC - Lipid panel  3. Vitamin D deficiency -He will continue to take his vitamin D contingent upon lab work being done today. - POCT CBC - Vit D  25 hydroxy (rtn osteoporosis monitoring)  4. ASCVD (arteriosclerotic cardiovascular disease) -He describes no chest pain or chest tightness - POCT CBC - BMP8+EGFR - Hepatic function panel - Lipid panel  5. Atherosclerosis of native coronary artery of native heart with angina pectoris with documented spasm -He has no chest pain or chest tightness and stays active and according to son does exercises in the home every day - POCT CBC - Lipid panel  6. Other fatigue -This is most likely age related but we will check a thyroid profile to be on the safe side - Thyroid Panel With TSH  7. Left upper arm injury, initial encounter -This skin tear of the left upper arm appears to be healing and there is minimal erythema and no drainage and no further treatment will be rendered to this.  8. Memory disorder -The patient does continue with his memory  disorder but he responds appropriately to questions asked of him and is very lighthearted about remaining independent and being able take care of himself.  Patient Instructions                       Medicare Annual Wellness Visit  [facility] and the medical providers at [facility] strive to bring you the best medical care.  In doing so we not only want to address your current medical conditions and concerns but also to detect new conditions early and prevent illness, disease and health-related problems.    Medicare offers a yearly Wellness Visit which allows our clinical staff to assess your need for preventative services including immunizations, lifestyle education, counseling to decrease risk of preventable diseases and screening for fall risk and other medical concerns.    This visit is provided free of charge (no copay) for all Medicare recipients. The clinical pharmacists at [facility] have begun to conduct these Wellness Visits which will also include a thorough  review of all your medications.    As you primary medical provider recommend that you make an appointment for your Annual Wellness Visit if you have not done so already this year.  You may set up this appointment before you leave today or you may call back ([phone]) and schedule an appointment.  Please make sure when you call that you mention that you are scheduling your Annual Wellness Visit with the clinical pharmacist so that the appointment may be made for the proper length of time.     Continue current medications. Continue good therapeutic lifestyle changes which include good diet and exercise. Fall precautions discussed with patient. If an FOBT was given today- please return it to our front desk. If you are over 69 years old - you may need Prevnar 63 or the adult Pneumonia vaccine.  Flu Shots are still available at our office. If you still haven't had one please call to set up  a nurse visit to get one.   After your visit with [place] today you will receive a survey in the mail or online from [employer] regarding your care with [place]. Please take a moment to fill this out. Your feedback is very important to [place] as you can help [place] better understand your patient needs as well as improve your experience and satisfaction. WE CARE ABOUT YOU!!!   Drink plenty of water Be careful and don't put herself at risk for falls Call you son if you need to  We will call you with your lab results once they are back.  If the wound on the arm does not continue to improve or gets more red or inflamed the patient should return to the office for further recheck Bring blood pressures by for review and a couple of weeks   Arrie Senate MD

## 2014-07-14 LAB — LIPID PANEL
CHOL/HDL RATIO: 3.8 ratio (ref 0.0–5.0)
CHOLESTEROL TOTAL: 212 mg/dL — AB (ref 100–199)
HDL: 56 mg/dL (ref >39)
LDL Calculated: 126 mg/dL — ABNORMAL HIGH (ref 0–99)
TRIGLYCERIDES: 148 mg/dL (ref 0–149)
VLDL Cholesterol Cal: 30 mg/dL (ref 5–40)

## 2014-07-14 LAB — BMP8+EGFR
BUN/Creatinine Ratio: 18 (ref 10–22)
BUN: 20 mg/dL (ref 8–27)
CALCIUM: 8.8 mg/dL (ref 8.6–10.2)
CO2: 22 mmol/L (ref 18–29)
Chloride: 103 mmol/L (ref 97–108)
Creatinine, Ser: 1.09 mg/dL (ref 0.76–1.27)
GFR calc Af Amer: 69 mL/min/{1.73_m2} (ref >59)
GFR, EST NON AFRICAN AMERICAN: 60 mL/min/{1.73_m2} (ref >59)
Glucose: 95 mg/dL (ref 65–99)
POTASSIUM: 4.8 mmol/L (ref 3.5–5.2)
Sodium: 138 mmol/L (ref 134–144)

## 2014-07-14 LAB — VITAMIN D 25 HYDROXY (VIT D DEFICIENCY, FRACTURES): VIT D 25 HYDROXY: 47.6 ng/mL (ref 30.0–100.0)

## 2014-07-14 LAB — THYROID PANEL WITH TSH
FREE THYROXINE INDEX: 2.1 (ref 1.2–4.9)
T3 Uptake Ratio: 30 % (ref 24–39)
T4 TOTAL: 7.1 ug/dL (ref 4.5–12.0)
TSH: 1.67 u[IU]/mL (ref 0.450–4.500)

## 2014-07-14 LAB — HEPATIC FUNCTION PANEL
ALK PHOS: 62 IU/L (ref 39–117)
ALT: 10 IU/L (ref 0–44)
AST: 14 IU/L (ref 0–40)
Albumin: 4 g/dL (ref 3.5–4.7)
BILIRUBIN, DIRECT: 0.06 mg/dL (ref 0.00–0.40)
Bilirubin Total: <0.2 mg/dL (ref 0.0–1.2)
Total Protein: 6.1 g/dL (ref 6.0–8.5)

## 2014-07-16 ENCOUNTER — Telehealth: Payer: Self-pay | Admitting: Family Medicine

## 2014-07-19 NOTE — Telephone Encounter (Signed)
Patient's son aware of results.

## 2014-08-11 ENCOUNTER — Other Ambulatory Visit: Payer: Medicare Other

## 2014-08-12 ENCOUNTER — Other Ambulatory Visit (INDEPENDENT_AMBULATORY_CARE_PROVIDER_SITE_OTHER): Payer: Medicare Other

## 2014-08-12 DIAGNOSIS — D649 Anemia, unspecified: Secondary | ICD-10-CM | POA: Diagnosis not present

## 2014-08-12 LAB — POCT CBC
GRANULOCYTE PERCENT: 62.9 % (ref 37–80)
HCT, POC: 38.1 % — AB (ref 43.5–53.7)
HEMOGLOBIN: 12 g/dL — AB (ref 14.1–18.1)
Lymph, poc: 2.1 (ref 0.6–3.4)
MCH, POC: 28.4 pg (ref 27–31.2)
MCHC: 31.5 g/dL — AB (ref 31.8–35.4)
MCV: 90.2 fL (ref 80–97)
MPV: 8.5 fL (ref 0–99.8)
PLATELET COUNT, POC: 249 10*3/uL (ref 142–424)
POC GRANULOCYTE: 4.7 (ref 2–6.9)
POC LYMPH %: 28.8 % (ref 10–50)
RBC: 4.22 M/uL — AB (ref 4.69–6.13)
RDW, POC: 13.1 %
WBC: 7.4 10*3/uL (ref 4.6–10.2)

## 2014-08-12 NOTE — Progress Notes (Signed)
Lab only 

## 2014-10-03 ENCOUNTER — Other Ambulatory Visit: Payer: Self-pay | Admitting: Family Medicine

## 2014-11-15 ENCOUNTER — Ambulatory Visit (INDEPENDENT_AMBULATORY_CARE_PROVIDER_SITE_OTHER): Payer: Medicare Other | Admitting: Family Medicine

## 2014-11-15 ENCOUNTER — Encounter: Payer: Self-pay | Admitting: Family Medicine

## 2014-11-15 VITALS — BP 143/77 | HR 71 | Temp 97.3°F | Ht 67.0 in | Wt 134.0 lb

## 2014-11-15 DIAGNOSIS — R413 Other amnesia: Secondary | ICD-10-CM

## 2014-11-15 DIAGNOSIS — I25111 Atherosclerotic heart disease of native coronary artery with angina pectoris with documented spasm: Secondary | ICD-10-CM

## 2014-11-15 DIAGNOSIS — N400 Enlarged prostate without lower urinary tract symptoms: Secondary | ICD-10-CM | POA: Diagnosis not present

## 2014-11-15 DIAGNOSIS — E7800 Pure hypercholesterolemia, unspecified: Secondary | ICD-10-CM

## 2014-11-15 DIAGNOSIS — I10 Essential (primary) hypertension: Secondary | ICD-10-CM | POA: Diagnosis not present

## 2014-11-15 DIAGNOSIS — I2510 Atherosclerotic heart disease of native coronary artery without angina pectoris: Secondary | ICD-10-CM | POA: Diagnosis not present

## 2014-11-15 DIAGNOSIS — E559 Vitamin D deficiency, unspecified: Secondary | ICD-10-CM | POA: Diagnosis not present

## 2014-11-15 NOTE — Patient Instructions (Addendum)
Medicare Annual Wellness Visit  [facility] and the medical providers at [facility] strive to bring you the best medical care.  In doing so we not only want to address your current medical conditions and concerns but also to detect new conditions early and prevent illness, disease and health-related problems.    Medicare offers a yearly Wellness Visit which allows our clinical staff to assess your need for preventative services including immunizations, lifestyle education, counseling to decrease risk of preventable diseases and screening for fall risk and other medical concerns.    This visit is provided free of charge (no copay) for all Medicare recipients. The clinical pharmacists at [facility] have begun to conduct these Wellness Visits which will also include a thorough review of all your medications.    As you primary medical provider recommend that you make an appointment for your Annual Wellness Visit if you have not done so already this year.  You may set up this appointment before you leave today or you may call back ([phone]) and schedule an appointment.  Please make sure when you call that you mention that you are scheduling your Annual Wellness Visit with the clinical pharmacist so that the appointment may be made for the proper length of time.     Continue current medications. Continue good therapeutic lifestyle changes which include good diet and exercise. Fall precautions discussed with patient. If an FOBT was given today- please return it to our front desk. If you are over 18 years old - you may need Prevnar 13 or the adult Pneumonia vaccine.  **Flu shots will be available soon--- please call and schedule a FLU-CLINIC appointment**  After your visit with [place] today you will receive a survey in the mail or online from [employer] regarding your care with [place]. Please take a moment to fill this out. Your feedback is  very important to [place] as you can help [place] better understand your patient needs as well as improve your experience and satisfaction. WE CARE ABOUT YOU!!!   **Please join [place] [email or web address], 2016 from 5:00 to 7:00pm for our OPEN HOUSE! Come out and meet our NEW providers** Continue to drink plenty of fluids Continue to be careful and don't climb and don't put yourself at risk for falling Continue current medications

## 2014-11-15 NOTE — Progress Notes (Signed)
Subjective:    Patient ID: Frank Fleming, male    DOB: February 09, 1924, 81 y.o.   MRN: [id number]  HPI Pt here for follow up and management of chronic medical problems which includes hypertension and hyperlipidemia. He is taking medications regularly and he is accompanied today by his son, Toni Arthurs. As usual the patient has no complaints. He will get lab work today return the FOBT that he has at home. He denies chest pain shortness of breath trouble swallowing trouble with his bowel movements or problems passing his water. His son lives close by and monitors him very closely. He is still driving but only around town and only short distances. He does seem to have some problems with general motor abilities and especially with climbing.      Patient Active Problem List   Diagnosis Date Noted  . Dizziness and giddiness 10/05/2013  . Memory impairment 05/28/2013  . HYPERCHOLESTEROLEMIA 11/14/2009  . UNSPECIFIED ESSENTIAL HYPERTENSION 11/14/2009  . CORONARY ATHEROSCLEROSIS NATIVE CORONARY ARTERY 11/14/2009  . PVD 11/14/2009   Outpatient Encounter Prescriptions as of 11/15/2014  Medication Sig  . aspirin 81 MG tablet Take 81 mg by mouth daily.  . Cholecalciferol (VITAMIN D PO) Take 1 capsule by mouth daily.  Marland Kitchen lisinopril (PRINIVIL,ZESTRIL) 2.5 MG tablet TAKE 1 TABLET (2.5 MG TOTAL) BY MOUTH DAILY.   No facility-administered encounter medications on file as of 11/15/2014.      Review of Systems  Constitutional: Negative.   HENT: Negative.   Eyes: Negative.   Respiratory: Negative.   Cardiovascular: Negative.   Gastrointestinal: Negative.   Endocrine: Negative.   Genitourinary: Negative.   Musculoskeletal: Negative.   Skin: Negative.   Allergic/Immunologic: Negative.   Neurological: Negative.   Hematological: Negative.   Psychiatric/Behavioral: Negative.        Objective:   Physical Exam  Constitutional: He is oriented to person, place, and time. He appears well-developed and  well-nourished. No distress.  Patient looks much than his stated age of 87 years. He is happy and in good spirits despite his gradual memory loss.  HENT:  Head: Normocephalic and atraumatic.  Right Ear: External ear normal.  Left Ear: External ear normal.  Nose: Nose normal.  Mouth/Throat: Oropharynx is clear and moist. No oropharyngeal exudate.  Eyes: Conjunctivae and EOM are normal. Pupils are equal, round, and reactive to light. Right eye exhibits no discharge. Left eye exhibits no discharge. No scleral icterus.  Neck: Normal range of motion. Neck supple. No thyromegaly present.  Cardiovascular: Normal rate, regular rhythm and normal heart sounds.   No murmur heard. At 72/m  Pulmonary/Chest: Effort normal and breath sounds normal. He has no wheezes. He has no rales.  Abdominal: Soft. Bowel sounds are normal. He exhibits no mass. There is no tenderness. There is no rebound and no guarding.  The abdomen is nontender without bruits  Musculoskeletal: Normal range of motion. He exhibits no edema or tenderness.  Minimal gait instability with walking  Lymphadenopathy:    He has no cervical adenopathy.  Neurological: He is alert and oriented to person, place, and time.  Skin: Skin is warm and dry. No rash noted.  Psychiatric: He has a normal mood and affect. His behavior is normal. Thought content normal.  The patient has a positive affect and continues to have some decline in memory. He did know his age today. He indicates he only drives around town and does not drive out of town. According to his son he exercises himself daily in  the house.  Nursing note and vitals reviewed.  BP 143/77 mmHg  Pulse 71  Temp(Src) 97.3 F (36.3 C) (Oral)  Ht 5' 7"  (1.702 m)  Wt 134 lb (60.782 kg)  BMI 20.98 kg/m2        Assessment & Plan:  1. HYPERCHOLESTEROLEMIA -Patient will continue with diet as he is been intolerant to statin drugs. - CBC with Differential/Platelet - Lipid panel  2.  Essential hypertension -His blood pressure was not far from goal and he will continue with current treatment - BMP8+EGFR - Hepatic function panel - CBC with Differential/Platelet  3. Vitamin D deficiency -Continue with vitamin D replacement depending upon results of lab work - CBC with Differential/Platelet - Vit D  25 hydroxy (rtn osteoporosis monitoring)  4. ASCVD (arteriosclerotic cardiovascular disease) -He is having no chest pain today and no treatment is necessary other than watching his diet and staying active as physically as possible. - CBC with Differential/Platelet - Lipid panel  5. Atherosclerosis of native coronary artery of native heart with angina pectoris with documented spasm -Continue with dietary management and exercise as tolerated - CBC with Differential/Platelet - Lipid panel  6. BPH (benign prostatic hyperplasia) -No complaints with voiding - CBC with Differential/Platelet  7. Memory disorder -His memory issues seem to be stable and he is calm and collected and not agitated. - CBC with Differential/Platelet Patient Instructions                       Medicare Annual Wellness Visit  [facility] and the medical providers at [facility] strive to bring you the best medical care.  In doing so we not only want to address your current medical conditions and concerns but also to detect new conditions early and prevent illness, disease and health-related problems.    Medicare offers a yearly Wellness Visit which allows our clinical staff to assess your need for preventative services including immunizations, lifestyle education, counseling to decrease risk of preventable diseases and screening for fall risk and other medical concerns.    This visit is provided free of charge (no copay) for all Medicare recipients. The clinical pharmacists at [facility] have begun to conduct these Wellness Visits which will also include a  thorough review of all your medications.    As you primary medical provider recommend that you make an appointment for your Annual Wellness Visit if you have not done so already this year.  You may set up this appointment before you leave today or you may call back ([phone]) and schedule an appointment.  Please make sure when you call that you mention that you are scheduling your Annual Wellness Visit with the clinical pharmacist so that the appointment may be made for the proper length of time.     Continue current medications. Continue good therapeutic lifestyle changes which include good diet and exercise. Fall precautions discussed with patient. If an FOBT was given today- please return it to our front desk. If you are over 41 years old - you may need Prevnar 80 or the adult Pneumonia vaccine.  **Flu shots will be available soon--- please call and schedule a FLU-CLINIC appointment**  After your visit with [place] today you will receive a survey in the mail or online from [employer] regarding your care with [place]. Please take a moment to fill this out. Your feedback is very important to [place] as you can help [place] better understand your patient needs  as well as improve your experience and satisfaction. WE CARE ABOUT YOU!!!   **Please join [place] [email or web address], 2016 from 5:00 to 7:00pm for our OPEN HOUSE! Come out and meet our NEW providers** Continue to drink plenty of fluids Continue to be careful and don't climb and don't put yourself at risk for falling Continue current medications   Arrie Senate MD

## 2014-11-16 LAB — LIPID PANEL
CHOLESTEROL TOTAL: 248 mg/dL — AB (ref 100–199)
Chol/HDL Ratio: 4.8 ratio units (ref 0.0–5.0)
HDL: 52 mg/dL (ref >39)
LDL CALC: 153 mg/dL — AB (ref 0–99)
TRIGLYCERIDES: 215 mg/dL — AB (ref 0–149)
VLDL CHOLESTEROL CAL: 43 mg/dL — AB (ref 5–40)

## 2014-11-16 LAB — BMP8+EGFR
BUN/Creatinine Ratio: 19 (ref 10–22)
BUN: 23 mg/dL (ref 8–27)
CALCIUM: 9.5 mg/dL (ref 8.6–10.2)
CHLORIDE: 102 mmol/L (ref 97–108)
CO2: 23 mmol/L (ref 18–29)
Creatinine, Ser: 1.23 mg/dL (ref 0.76–1.27)
GFR calc Af Amer: 60 mL/min/{1.73_m2} (ref >59)
GFR calc non Af Amer: 52 mL/min/{1.73_m2} — ABNORMAL LOW (ref >59)
Glucose: 86 mg/dL (ref 65–99)
POTASSIUM: 4.8 mmol/L (ref 3.5–5.2)
Sodium: 139 mmol/L (ref 134–144)

## 2014-11-16 LAB — CBC WITH DIFFERENTIAL/PLATELET
BASOS ABS: 0 10*3/uL (ref 0.0–0.2)
Basos: 1 %
EOS (ABSOLUTE): 0.3 10*3/uL (ref 0.0–0.4)
Eos: 4 %
HEMOGLOBIN: 12.9 g/dL (ref 12.6–17.7)
Hematocrit: 40.2 % (ref 37.5–51.0)
IMMATURE GRANULOCYTES: 0 %
Immature Grans (Abs): 0 10*3/uL (ref 0.0–0.1)
Lymphocytes Absolute: 2.2 10*3/uL (ref 0.7–3.1)
Lymphs: 28 %
MCH: 29.6 pg (ref 26.6–33.0)
MCHC: 32.1 g/dL (ref 31.5–35.7)
MCV: 92 fL (ref 79–97)
MONOCYTES: 14 %
Monocytes Absolute: 1.1 10*3/uL — ABNORMAL HIGH (ref 0.1–0.9)
NEUTROS ABS: 4.2 10*3/uL (ref 1.4–7.0)
Neutrophils: 53 %
PLATELETS: 311 10*3/uL (ref 150–379)
RBC: 4.36 x10E6/uL (ref 4.14–5.80)
RDW: 13.7 % (ref 12.3–15.4)
WBC: 7.9 10*3/uL (ref 3.4–10.8)

## 2014-11-16 LAB — VITAMIN D 25 HYDROXY (VIT D DEFICIENCY, FRACTURES): Vit D, 25-Hydroxy: 43.9 ng/mL (ref 30.0–100.0)

## 2014-11-16 LAB — HEPATIC FUNCTION PANEL
ALT: 11 IU/L (ref 0–44)
AST: 13 IU/L (ref 0–40)
Albumin: 4.2 g/dL (ref 3.5–4.7)
Alkaline Phosphatase: 71 IU/L (ref 39–117)
Bilirubin, Direct: 0.06 mg/dL (ref 0.00–0.40)
Total Protein: 6.8 g/dL (ref 6.0–8.5)

## 2014-12-26 IMAGING — CT CT HEAD W/O CM
1 series · 16 of 30 positions shown, 20 images · non-contrast
Comparison: None.

CLINICAL DATA: Slurred speech

EXAM:
CT HEAD WITHOUT CONTRAST
TECHNIQUE: Contiguous axial images were obtained from the base of the skull
through the vertex without intravenous contrast.

[Series 2: headseq 4.8 h37s · axial · 0.46mm/px · z∈[+1288,+1443]mm · 16 of 36 slices shown, 20 images]
[im 2/36  brain]
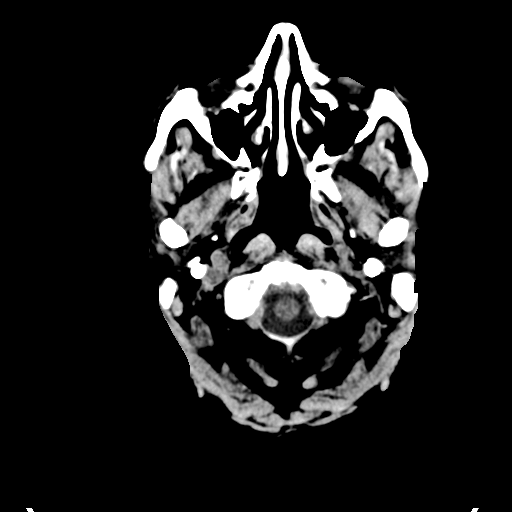
[im 2/36  bone]
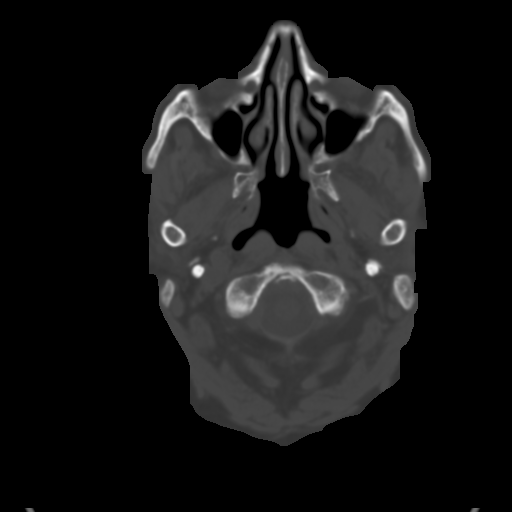
[im 4/36  brain]
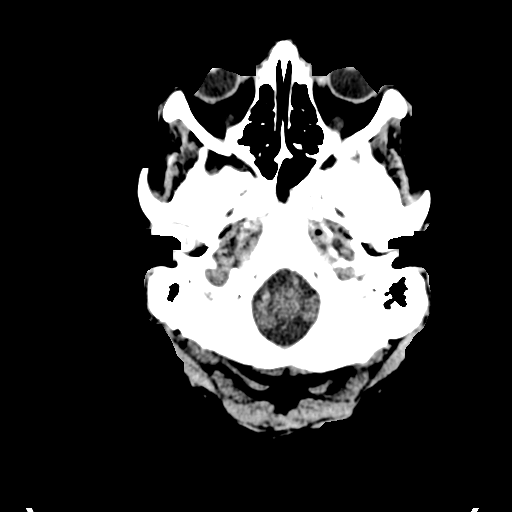
[im 7/36  brain]
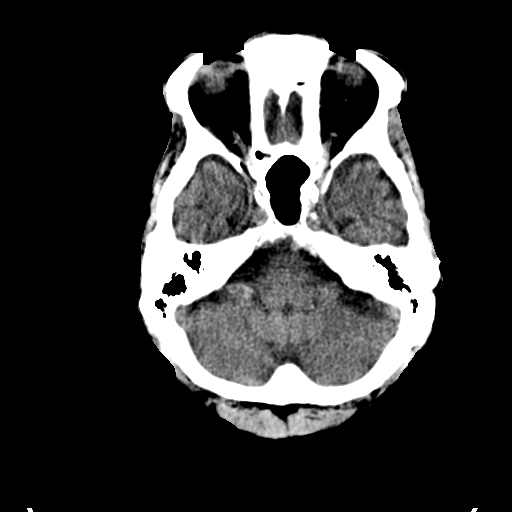
[im 9/36  brain]
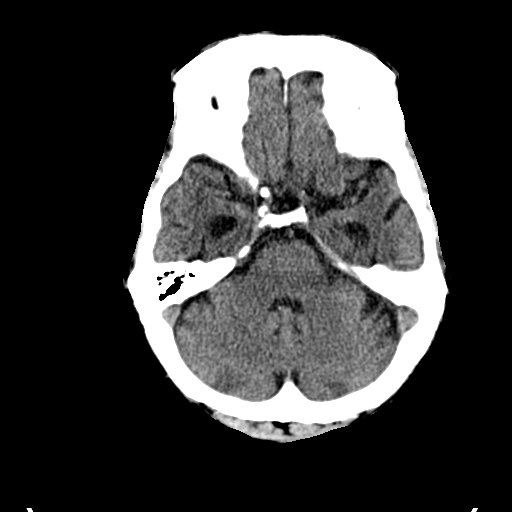
[im 10/36  brain]
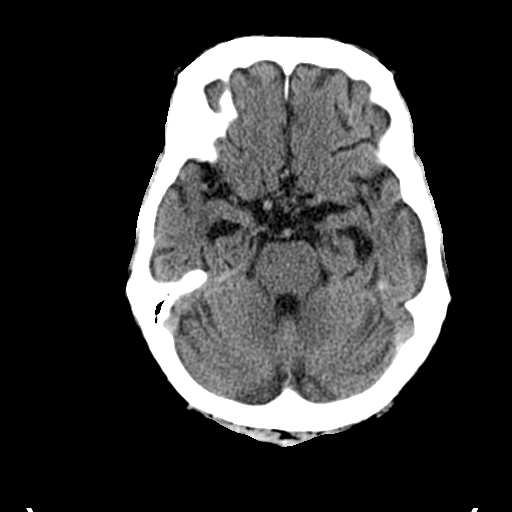
[im 10/36  bone]
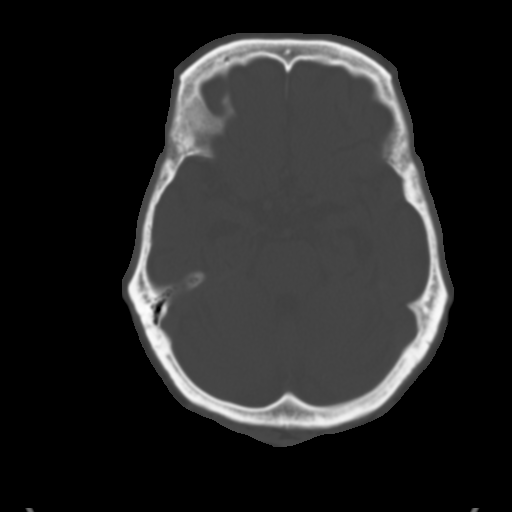
[im 13/36  brain]
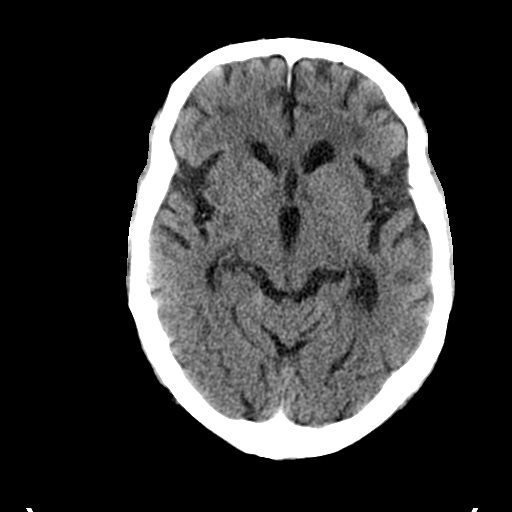
[im 15/36  brain]
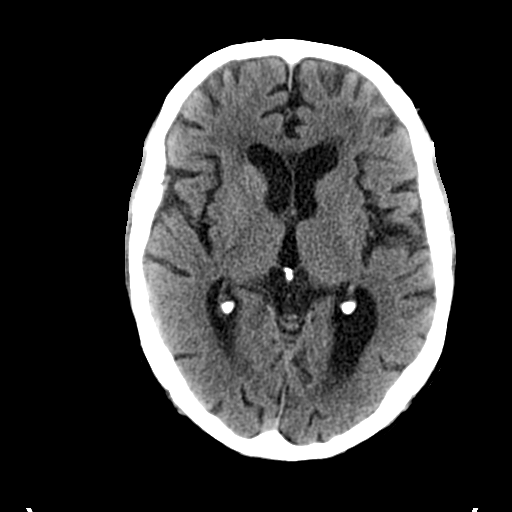
[im 17/36  brain]
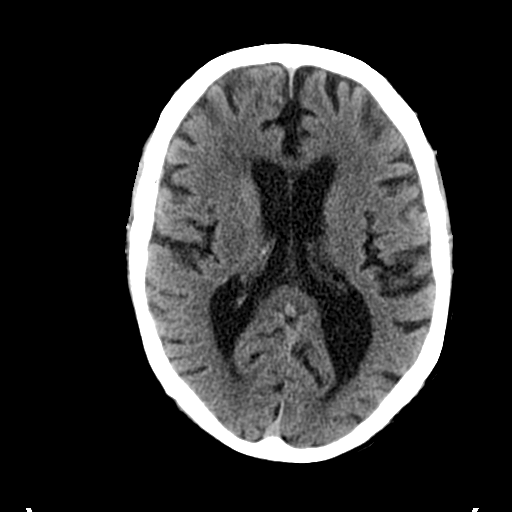
[im 19/36  brain]
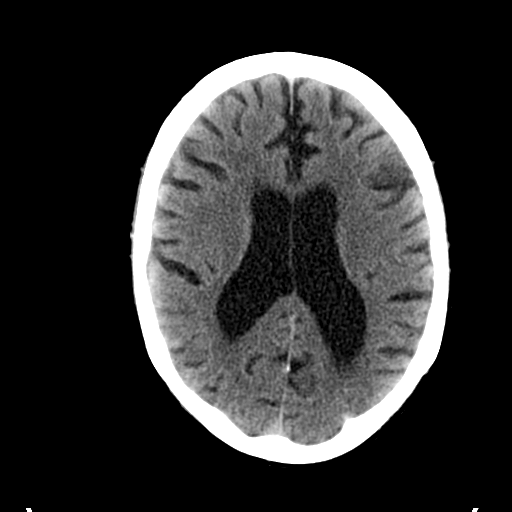
[im 19/36  bone]
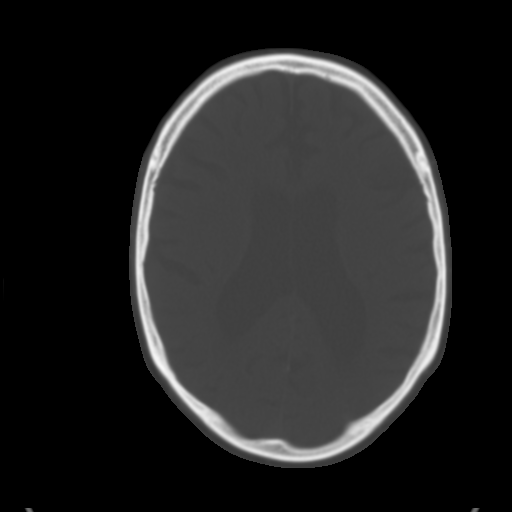
[im 21/36  brain]
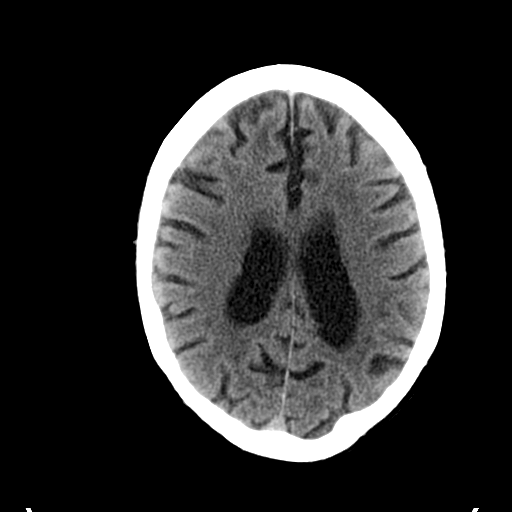
[im 23/36  brain]
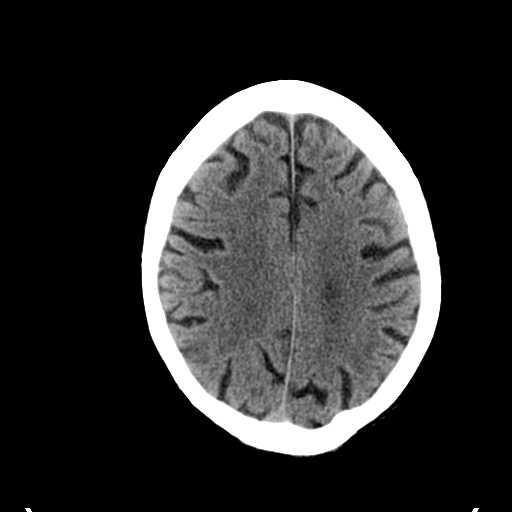
[im 26/36  brain]
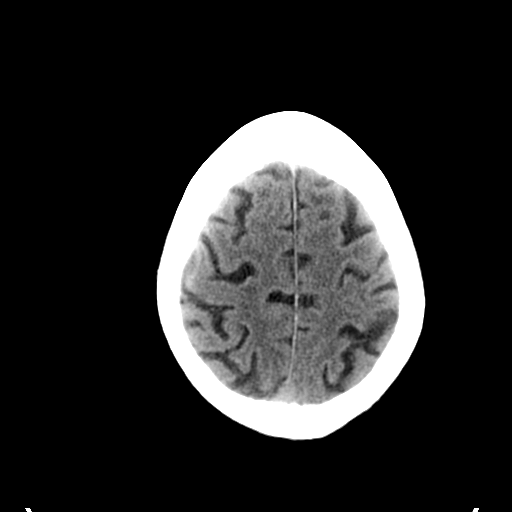
[im 27/36  brain]
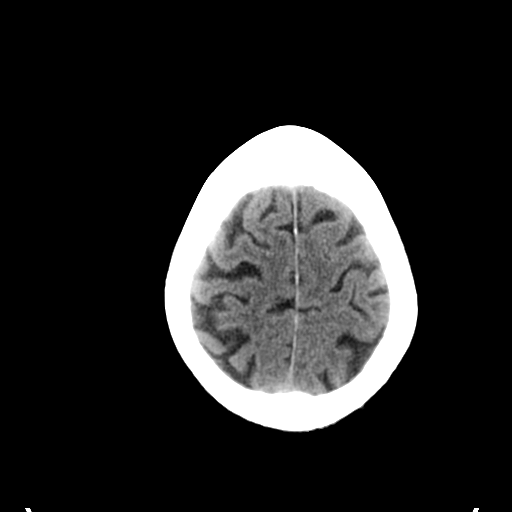
[im 27/36  bone]
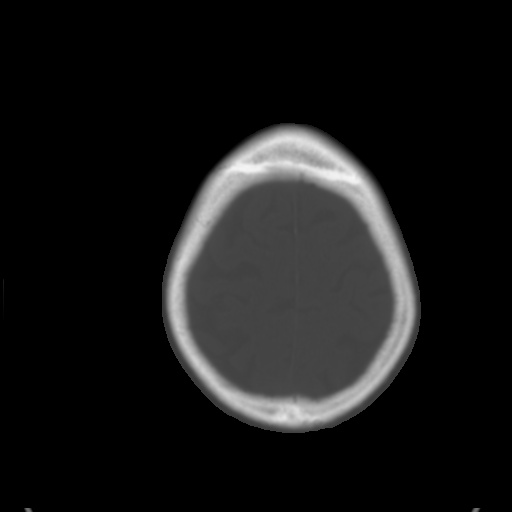
[im 29/36  brain]
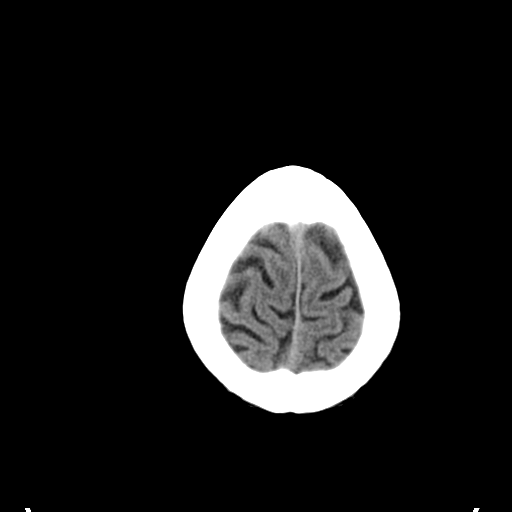
[im 32/36  brain]
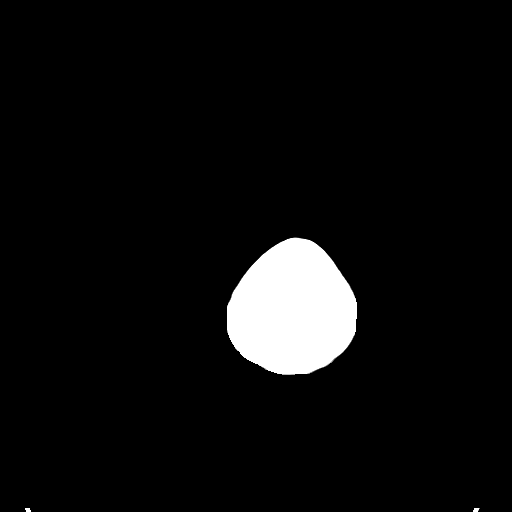
[im 34/36  brain]
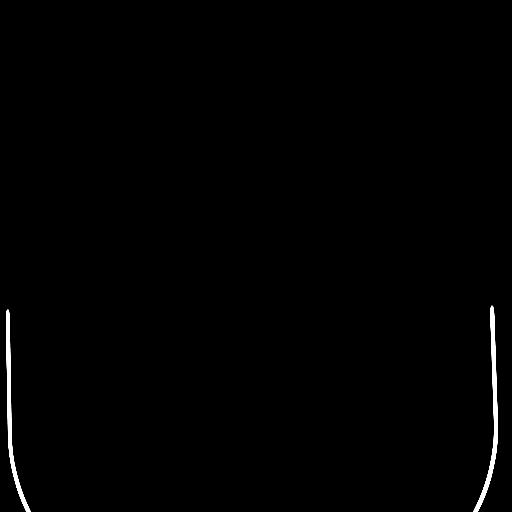

[16 of 30 positions shown; findings below may reference images not displayed]

FINDINGS: The bony calvarium is intact. Mild atrophic changes are seen. No
findings to suggest acute hemorrhage, acute infarction or
space-occupying mass lesion are noted.
IMPRESSION: Chronic changes without acute abnormality.

## 2015-01-08 ENCOUNTER — Other Ambulatory Visit: Payer: Self-pay | Admitting: Family Medicine

## 2015-01-13 ENCOUNTER — Other Ambulatory Visit: Payer: Self-pay | Admitting: *Deleted

## 2015-01-13 MED ORDER — LISINOPRIL 2.5 MG PO TABS: ORAL_TABLET | ORAL | Status: DC

## 2015-01-24 ENCOUNTER — Encounter: Payer: Self-pay | Admitting: Family Medicine

## 2015-01-24 ENCOUNTER — Ambulatory Visit (INDEPENDENT_AMBULATORY_CARE_PROVIDER_SITE_OTHER): Payer: Medicare Other | Admitting: Family Medicine

## 2015-01-24 VITALS — BP 188/77 | HR 68 | Temp 96.6°F | Ht 67.0 in | Wt 137.0 lb

## 2015-01-24 DIAGNOSIS — I10 Essential (primary) hypertension: Secondary | ICD-10-CM | POA: Diagnosis not present

## 2015-01-24 DIAGNOSIS — R040 Epistaxis: Secondary | ICD-10-CM | POA: Diagnosis not present

## 2015-01-24 DIAGNOSIS — Z23 Encounter for immunization: Secondary | ICD-10-CM | POA: Diagnosis not present

## 2015-01-24 NOTE — Patient Instructions (Addendum)
Keep the house cooler Use a cool mist humidifier Use Ocean nasal spray or saline nasal spray each nostril 4 times daily Use nasal gel as directed in each nostril Increase blood pressure pill to 2 daily for a total of 5 mg-----this would be a total of lisinopril of 5 mg daily----if this works well we can call in a prescription for 5 mg when you run out of the 2-1/2 mg pills Bring blood pressure readings in for review in a couple weeks Patient should come by in a couple weeks to have nurse recheck blood pressure and bring outside readings to that visit

## 2015-01-24 NOTE — Progress Notes (Signed)
Subjective:    Patient ID: Frank Fleming, male    DOB: 1924/03/03, 81 y.o.   MRN: [id number]  HPI Patient here today for nose bleeds. He is accompanied today by his son. The son has a blood pressure from home it was 178/85. He says that his father keeps the house very hot sometimes up to 90.      Patient Active Problem List   Diagnosis Date Noted  . Dizziness and giddiness 10/05/2013  . Memory impairment 05/28/2013  . HYPERCHOLESTEROLEMIA 11/14/2009  . UNSPECIFIED ESSENTIAL HYPERTENSION 11/14/2009  . CORONARY ATHEROSCLEROSIS NATIVE CORONARY ARTERY 11/14/2009  . PVD 11/14/2009   Outpatient Encounter Prescriptions as of 01/24/2015  Medication Sig  . aspirin 81 MG tablet Take 81 mg by mouth daily.  . Cholecalciferol (VITAMIN D PO) Take 1 capsule by mouth daily.  Marland Kitchen lisinopril (PRINIVIL,ZESTRIL) 2.5 MG tablet TAKE 1 TABLET (2.5 MG TOTAL) BY MOUTH DAILY.   No facility-administered encounter medications on file as of 01/24/2015.      Review of Systems  Constitutional: Negative.   HENT: Positive for nosebleeds.   Eyes: Negative.   Respiratory: Negative.   Cardiovascular: Negative.   Gastrointestinal: Negative.   Endocrine: Negative.   Genitourinary: Negative.   Musculoskeletal: Negative.   Skin: Negative.   Allergic/Immunologic: Negative.   Neurological: Negative.   Hematological: Negative.   Psychiatric/Behavioral: Negative.        More forgetful        Objective:   Physical Exam  Constitutional: He is oriented to person, place, and time. He appears well-developed and well-nourished. No distress.  HENT:  Head: Normocephalic and atraumatic.  Nose: Nose normal.  Slight nasal septal irritation on the left  Eyes: Conjunctivae and EOM are normal. Pupils are equal, round, and reactive to light. Right eye exhibits no discharge. Left eye exhibits no discharge. No scleral icterus.  Neck: Normal range of motion.  Cardiovascular: Regular rhythm and normal heart sounds.     No murmur heard. Rhythm is regular at 72/m  Pulmonary/Chest: Effort normal and breath sounds normal. No respiratory distress. He has no wheezes. He has no rales. He exhibits no tenderness.  Abdominal: He exhibits no mass.  Musculoskeletal: Normal range of motion. He exhibits no edema.  Neurological: He is alert and oriented to person, place, and time.  Skin: Skin is warm and dry. No rash noted.  Psychiatric: He has a normal mood and affect. His behavior is normal. Thought content normal.  Nursing note and vitals reviewed.  BP 188/77 mmHg  Pulse 68  Temp(Src) 96.6 F (35.9 C) (Oral)  Ht  (1.702 m)  Wt 137 lb (62.143 kg)  BMI 21.45 kg/m2  The patient will see the flu shot while he is here today.      Assessment & Plan:  1. Bleeding nose -Use nasal saline and nasal gel as directed and keep the house as cool as possible -Use cool mist humidification in the hands  2. Essential hypertension -Increase lisinopril to 5 mg daily -Continue to watch sodium intake  Patient Instructions  Keep the house cooler Use a cool mist humidifier Use Ocean nasal spray or saline nasal spray each nostril 4 times daily Use nasal gel as directed in each nostril Increase blood pressure pill to 2 daily for a total of 5 mg-----this would be a total of lisinopril of 5 mg daily----if this works well we can call in a prescription for 5 mg when you run out of the 2-1/2 mg  pills Bring blood pressure readings in for review in a couple weeks Patient should come by in a couple weeks to have nurse recheck blood pressure and bring outside readings to that visit   Nyra Capeson W. Tahirih Lair MD

## 2015-02-07 ENCOUNTER — Telehealth: Payer: Self-pay | Admitting: Family Medicine

## 2015-02-07 ENCOUNTER — Other Ambulatory Visit: Payer: Self-pay | Admitting: *Deleted

## 2015-02-07 MED ORDER — LISINOPRIL 2.5 MG PO TABS: ORAL_TABLET | ORAL | Status: DC

## 2015-02-07 NOTE — Telephone Encounter (Signed)
Stp and advised he has 3 refills at the pharmacy. Pt voiced understanding.

## 2015-02-07 NOTE — Telephone Encounter (Signed)
Please advise and route to Pool A 

## 2015-02-07 NOTE — Telephone Encounter (Signed)
Give 60 day supply of 2.5 mg as requested and take one to 2 daily

## 2015-03-30 ENCOUNTER — Ambulatory Visit: Payer: Medicare Other | Admitting: Family Medicine

## 2015-03-31 ENCOUNTER — Encounter: Payer: Self-pay | Admitting: Family Medicine

## 2015-04-28 ENCOUNTER — Telehealth: Payer: Self-pay | Admitting: Family Medicine

## 2015-04-28 NOTE — Telephone Encounter (Signed)
Pt has appt with [facility] 2/27 and note added in the appt notes to reschedule Medicare Wellness visit.

## 2015-05-02 ENCOUNTER — Ambulatory Visit (INDEPENDENT_AMBULATORY_CARE_PROVIDER_SITE_OTHER): Payer: Medicare Other | Admitting: Family Medicine

## 2015-05-02 ENCOUNTER — Encounter: Payer: Self-pay | Admitting: Family Medicine

## 2015-05-02 VITALS — BP 151/74 | HR 79 | Temp 96.9°F | Ht 67.0 in | Wt 141.0 lb

## 2015-05-02 DIAGNOSIS — R413 Other amnesia: Secondary | ICD-10-CM | POA: Diagnosis not present

## 2015-05-02 DIAGNOSIS — I10 Essential (primary) hypertension: Secondary | ICD-10-CM

## 2015-05-02 DIAGNOSIS — I2510 Atherosclerotic heart disease of native coronary artery without angina pectoris: Secondary | ICD-10-CM | POA: Diagnosis not present

## 2015-05-02 DIAGNOSIS — R630 Anorexia: Secondary | ICD-10-CM | POA: Diagnosis not present

## 2015-05-02 NOTE — Patient Instructions (Signed)
The patient is eating and activity habits should be monitored as closely as possible Try the insurer daily at lunchtime Monitor his intake as much as possible He should not drive anymore. Check home blood pressures when possible

## 2015-05-02 NOTE — Progress Notes (Signed)
Subjective:    Patient ID: Frank Fleming, male    DOB: [birth year], 82 y.o.   MRN: [id number]  HPI Patient here today for routine changes and decrease in appetite. He is accompanied today by his son, Toni Arthurs. The son is concerned with his father because he is not eating as much and his routine is changed to only eating twice daily. He is wondering if maybe some, supplements would be helpful. He also is concerned that his mouth and face have been swelling at times. His actual weight is up 4 pounds since last visit. This patient never has any complaints. He always denies any problems in your asking about. His son is living next to him and keeps an eye on him but the patient does get out and still drive at times. The son believes that he is not eating as much at lunchtime now. He has left some in sure and he will drink this. The patient denies chest pain shortness of breath trouble swallowing and heartburn indigestion nausea vomiting diarrhea or blood in the stool. His bowels have been moving regularly. He is not having trouble passing his water. The son thinks that he may have gotten into some other kind of cream or lotion and thought it was 2 days. He plans to go in and remove any tube of anything other than toothpaste and mouthwash.      Patient Active Problem List   Diagnosis Date Noted  . Dizziness and giddiness 10/05/2013  . Memory impairment 05/28/2013  . HYPERCHOLESTEROLEMIA 11/14/2009  . UNSPECIFIED ESSENTIAL HYPERTENSION 11/14/2009  . CORONARY ATHEROSCLEROSIS NATIVE CORONARY ARTERY 11/14/2009  . PVD 11/14/2009   Outpatient Encounter Prescriptions as of 05/02/2015  Medication Sig  . aspirin 81 MG tablet Take 81 mg by mouth daily.  . Cholecalciferol (VITAMIN D PO) Take 1 capsule by mouth daily.  Marland Kitchen lisinopril (PRINIVIL,ZESTRIL) 2.5 MG tablet TAKE 1 TABLET (2.5 MG TOTAL) TWICE A DAY   No facility-administered encounter medications on file as of 05/02/2015.     Review of Systems    Constitutional: Positive for appetite change (decreased).  HENT: Negative.   Eyes: Negative.   Respiratory: Negative.   Cardiovascular: Negative.   Gastrointestinal: Negative.   Endocrine: Negative.   Genitourinary: Negative.   Musculoskeletal: Negative.   Skin: Negative.   Allergic/Immunologic: Negative.   Neurological: Negative.   Hematological: Negative.   Psychiatric/Behavioral: Negative.        Objective:   Physical Exam  Constitutional: He is oriented to person, place, and time. He appears well-developed and well-nourished.  HENT:  Head: Normocephalic and atraumatic.  Right Ear: External ear normal.  Left Ear: External ear normal.  Nose: Nose normal.  Mouth/Throat: Oropharynx is clear and moist. No oropharyngeal exudate.  Eyes: Conjunctivae and EOM are normal. Pupils are equal, round, and reactive to light. Right eye exhibits no discharge. Left eye exhibits no discharge. No scleral icterus.  Neck: Normal range of motion. Neck supple. No thyromegaly present.  Cardiovascular: Normal rate, regular rhythm and normal heart sounds.   No murmur heard. Pulmonary/Chest: Effort normal and breath sounds normal. No respiratory distress. He has no wheezes. He has no rales. He exhibits no tenderness.  Abdominal: Soft. Bowel sounds are normal. He exhibits no mass. There is no tenderness. There is no rebound and no guarding.  Musculoskeletal: Normal range of motion. He exhibits no edema.  Lymphadenopathy:    He has no cervical adenopathy.  Neurological: He is alert and oriented to person, place,  and time. No cranial nerve deficit.  Skin: Skin is warm and dry. No rash noted.  Psychiatric: He has a normal mood and affect. His behavior is normal. Thought content normal.  The patient's mood and affect are positive. I'm not sure of his judgment. He has poor memory.  Nursing note and vitals reviewed.  BP 151/74 mmHg  Pulse 79  Temp(Src) 96.9 F (36.1 C) (Oral)  Ht 5' 7"  (1.702 m)  Wt  141 lb (63.957 kg)  BMI 22.08 kg/m2        Assessment & Plan:  1. Loss of appetite -We will monitor this more closely and make sure that he has some in sure to drink daily at lunchtime - BMP8+EGFR - CBC with Differential/Platelet - Hepatic function panel  2. Memory impairment -He has been intolerant of Aricept and Namenda in the past. -He should not be driving anymore and this was made clear to him and his son.  3. Essential hypertension -His blood pressure is good today we will continue with current treatment  4. Atherosclerosis of native coronary artery of native heart without angina pectoris -Continue with watching his diet as closely as possible and exercising as much as possible with supervision  Patient Instructions  The patient is eating and activity habits should be monitored as closely as possible Try the insurer daily at lunchtime Monitor his intake as much as possible He should not drive anymore. Check home blood pressures when possible   Arrie Senate MD

## 2015-05-03 LAB — HEPATIC FUNCTION PANEL
ALBUMIN: 4.1 g/dL (ref 3.5–4.7)
ALT: 14 IU/L (ref 0–44)
AST: 14 IU/L (ref 0–40)
Alkaline Phosphatase: 81 IU/L (ref 39–117)
Bilirubin Total: 0.2 mg/dL (ref 0.0–1.2)
Bilirubin, Direct: 0.08 mg/dL (ref 0.00–0.40)
Total Protein: 6.7 g/dL (ref 6.0–8.5)

## 2015-05-03 LAB — BMP8+EGFR
BUN/Creatinine Ratio: 22 (ref 10–22)
BUN: 27 mg/dL (ref 8–27)
CALCIUM: 9.1 mg/dL (ref 8.6–10.2)
CO2: 21 mmol/L (ref 18–29)
CREATININE: 1.25 mg/dL (ref 0.76–1.27)
Chloride: 100 mmol/L (ref 96–106)
GFR calc Af Amer: 59 mL/min/{1.73_m2} — ABNORMAL LOW (ref >59)
GFR calc non Af Amer: 51 mL/min/{1.73_m2} — ABNORMAL LOW (ref >59)
GLUCOSE: 93 mg/dL (ref 65–99)
Potassium: 4.9 mmol/L (ref 3.5–5.2)
SODIUM: 138 mmol/L (ref 134–144)

## 2015-05-03 LAB — CBC WITH DIFFERENTIAL/PLATELET
Basophils Absolute: 0 10*3/uL (ref 0.0–0.2)
Basos: 0 %
EOS (ABSOLUTE): 0.3 10*3/uL (ref 0.0–0.4)
EOS: 3 %
HEMATOCRIT: 38.6 % (ref 37.5–51.0)
Hemoglobin: 12.6 g/dL (ref 12.6–17.7)
IMMATURE GRANULOCYTES: 0 %
Immature Grans (Abs): 0 10*3/uL (ref 0.0–0.1)
Lymphocytes Absolute: 2.3 10*3/uL (ref 0.7–3.1)
Lymphs: 25 %
MCH: 30.2 pg (ref 26.6–33.0)
MCHC: 32.6 g/dL (ref 31.5–35.7)
MCV: 93 fL (ref 79–97)
MONOS ABS: 1.3 10*3/uL — AB (ref 0.1–0.9)
Monocytes: 13 %
NEUTROS PCT: 59 %
Neutrophils Absolute: 5.6 10*3/uL (ref 1.4–7.0)
PLATELETS: 328 10*3/uL (ref 150–379)
RBC: 4.17 x10E6/uL (ref 4.14–5.80)
RDW: 13.9 % (ref 12.3–15.4)
WBC: 9.5 10*3/uL (ref 3.4–10.8)

## 2015-05-17 ENCOUNTER — Ambulatory Visit: Payer: Medicare Other | Admitting: Family Medicine

## 2015-06-13 ENCOUNTER — Encounter: Payer: Self-pay | Admitting: Family Medicine

## 2015-06-13 ENCOUNTER — Ambulatory Visit (INDEPENDENT_AMBULATORY_CARE_PROVIDER_SITE_OTHER): Payer: Medicare Other | Admitting: Family Medicine

## 2015-06-13 ENCOUNTER — Encounter: Payer: Self-pay | Admitting: *Deleted

## 2015-06-13 ENCOUNTER — Encounter (INDEPENDENT_AMBULATORY_CARE_PROVIDER_SITE_OTHER): Payer: Self-pay

## 2015-06-13 ENCOUNTER — Ambulatory Visit (INDEPENDENT_AMBULATORY_CARE_PROVIDER_SITE_OTHER): Payer: Medicare Other

## 2015-06-13 VITALS — BP 154/72 | HR 84 | Temp 97.2°F | Ht 67.0 in | Wt 141.0 lb

## 2015-06-13 DIAGNOSIS — M79642 Pain in left hand: Secondary | ICD-10-CM

## 2015-06-13 DIAGNOSIS — M19042 Primary osteoarthritis, left hand: Secondary | ICD-10-CM | POA: Diagnosis not present

## 2015-06-13 MED ORDER — METHYLPREDNISOLONE ACETATE 80 MG/ML IJ SUSP
40.0000 mg | Freq: Once | INTRAMUSCULAR | Status: AC
  Administered 2015-06-13: 40 mg via INTRAMUSCULAR

## 2015-06-13 NOTE — Patient Instructions (Signed)
Use a wrist brace on the left hand for a few days in order to help restrict the use of this Use warm wet compresses 20 minutes 3 or 4 times daily to the wrist and hand Take Tylenol if needed for pain

## 2015-06-13 NOTE — Progress Notes (Signed)
   Subjective:    Patient ID: Frank Fleming, male    DOB: 08/11/1923, 82 y.o.   MRN: [id number]  HPI Patient here today for left hand pain and swelling. He is unsure of any injury. He is accompanied today by his son.The patient does not recall any injury. The patient has a memory impairment. According to the son who came with him he is been using his hand a lot with lifting pushing and hitting certain objects. The patient has no memory of doing any of this.     Patient Active Problem List   Diagnosis Date Noted  . Dizziness and giddiness 10/05/2013  . Memory impairment 05/28/2013  . HYPERCHOLESTEROLEMIA 11/14/2009  . Essential hypertension 11/14/2009  . CORONARY ATHEROSCLEROSIS NATIVE CORONARY ARTERY 11/14/2009  . PVD 11/14/2009   Outpatient Encounter Prescriptions as of 06/13/2015  Medication Sig  . aspirin 81 MG tablet Take 81 mg by mouth daily.  . Cholecalciferol (VITAMIN D PO) Take 1 capsule by mouth daily.  Marland Kitchen. lisinopril (PRINIVIL,ZESTRIL) 2.5 MG tablet TAKE 1 TABLET (2.5 MG TOTAL) TWICE A DAY   No facility-administered encounter medications on file as of 06/13/2015.      Review of Systems  Constitutional: Negative.   HENT: Negative.   Eyes: Negative.   Respiratory: Negative.   Cardiovascular: Negative.   Gastrointestinal: Negative.   Endocrine: Negative.   Genitourinary: Negative.   Musculoskeletal: Positive for arthralgias (left hand swelling and soreness).  Skin: Negative.   Allergic/Immunologic: Negative.   Neurological: Negative.   Hematological: Negative.   Psychiatric/Behavioral: Negative.        Objective:   Physical Exam BP 154/72 mmHg  Pulse 84  Temp(Src) 97.2 F (36.2 C) (Oral)  Ht 5\' 7"  (1.702 m)  Wt 141 lb (63.957 kg)  BMI 22.08 kg/m2 The hand at the first radio carpal joint on the left has a lot of swelling. More than on the right. There is minimal tenderness and no rubor.  [facility] reading (PRIMARY) by  Dr. Shawn StallMoore-left hand severe osteoarthritis  of the first radiocarpal joint of the left hand  The x-ray was reviewed with the son and he is given the results for left the office and he is given a copy of the report                                         Assessment & Plan:  1. Left hand pain -Severe arthritis first radiocarpal joint - DG Hand Complete Left; Future  2. Primary osteoarthritis of left hand -Patient can take extra strength Tylenol for pain -He should use a wrist brace for a few days to limit him using his hand to let the inflammation settled down and hopefully the shot of Depo-Medrol will also help. - methylPREDNISolone acetate (DEPO-MEDROL) injection 40 mg; Inject 0.5 mLs (40 mg total) into the muscle once.  Patient Instructions  Use a wrist brace on the left hand for a few days in order to help restrict the use of this Use warm wet compresses 20 minutes 3 or 4 times daily to the wrist and hand Take Tylenol if needed for pain    Nyra Capeson W. Xandrea Clarey MD

## 2015-07-13 ENCOUNTER — Telehealth: Payer: Self-pay | Admitting: Family Medicine

## 2015-07-13 NOTE — Telephone Encounter (Signed)
Only Tylenol at his age and wear a wrist brace a little more often and see if this does not help

## 2015-07-28 DIAGNOSIS — J40 Bronchitis, not specified as acute or chronic: Secondary | ICD-10-CM | POA: Diagnosis not present

## 2015-07-28 DIAGNOSIS — R05 Cough: Secondary | ICD-10-CM | POA: Diagnosis not present

## 2015-08-31 NOTE — Telephone Encounter (Signed)
Will close encounter as pt hasn't returned call. 

## 2015-09-28 ENCOUNTER — Encounter: Payer: Self-pay | Admitting: Family Medicine

## 2015-09-28 ENCOUNTER — Ambulatory Visit (INDEPENDENT_AMBULATORY_CARE_PROVIDER_SITE_OTHER): Payer: Medicare Other

## 2015-09-28 ENCOUNTER — Ambulatory Visit (INDEPENDENT_AMBULATORY_CARE_PROVIDER_SITE_OTHER): Payer: Medicare Other | Admitting: Family Medicine

## 2015-09-28 VITALS — BP 153/79 | HR 89 | Temp 97.0°F | Ht 67.0 in | Wt 140.0 lb

## 2015-09-28 DIAGNOSIS — E559 Vitamin D deficiency, unspecified: Secondary | ICD-10-CM

## 2015-09-28 DIAGNOSIS — E7800 Pure hypercholesterolemia, unspecified: Secondary | ICD-10-CM

## 2015-09-28 DIAGNOSIS — I2510 Atherosclerotic heart disease of native coronary artery without angina pectoris: Secondary | ICD-10-CM | POA: Diagnosis not present

## 2015-09-28 DIAGNOSIS — R413 Other amnesia: Secondary | ICD-10-CM | POA: Diagnosis not present

## 2015-09-28 DIAGNOSIS — I10 Essential (primary) hypertension: Secondary | ICD-10-CM | POA: Diagnosis not present

## 2015-09-28 DIAGNOSIS — N400 Enlarged prostate without lower urinary tract symptoms: Secondary | ICD-10-CM | POA: Diagnosis not present

## 2015-09-28 NOTE — Patient Instructions (Addendum)
Medicare Annual Wellness Visit  [facility] and the medical providers at [facility] strive to bring you the best medical care.  In doing so we not only want to address your current medical conditions and concerns but also to detect new conditions early and prevent illness, disease and health-related problems.    Medicare offers a yearly Wellness Visit which allows our clinical staff to assess your need for preventative services including immunizations, lifestyle education, counseling to decrease risk of preventable diseases and screening for fall risk and other medical concerns.    This visit is provided free of charge (no copay) for all Medicare recipients. The clinical pharmacists at [facility] have begun to conduct these Wellness Visits which will also include a thorough review of all your medications.    As you primary medical provider recommend that you make an appointment for your Annual Wellness Visit if you have not done so already this year.  You may set up this appointment before you leave today or you may call back ([phone]) and schedule an appointment.  Please make sure when you call that you mention that you are scheduling your Annual Wellness Visit with the clinical pharmacist so that the appointment may be made for the proper length of time.     Continue current medications. Continue good therapeutic lifestyle changes which include good diet and exercise. Fall precautions discussed with patient. If an FOBT was given today- please return it to our front desk. If you are over 14 years old - you may need Prevnar 13 or the adult Pneumonia vaccine.   After your visit with [place] today you will receive a survey in the mail or online from [employer] regarding your care with [place]. Please take a moment to fill this out. Your feedback is very important to [place] as you can help [place] better understand your patient needs as well as  improve your experience and satisfaction. WE CARE ABOUT YOU!!!   The son will continue to monitor his father regularly He has not driven since the last visit which is a good thing He seems to have good care from the son at home. His weight is stable. We will continue with his current treatment with close supervision by the son.

## 2015-09-28 NOTE — Progress Notes (Signed)
Subjective:    Patient ID: Frank Fleming, male    DOB: [birth year], 82 y.o.   MRN: [id number]  HPI  Pt here for follow up and management of chronic medical problems which includes hypertension. He is taking medications regularly.The patient comes to the visit today with his son. His dementia issues continue. He's been tried on Aricept but did not tolerate this. He is been tried on higher blood pressure medicine but did not tolerate this. The patient is pleasant and has no complaints. His memory appears to be getting worse. He denies any chest pain or shortness of breath. He denies any trouble with his intestinal tract including nausea vomiting diarrhea or blood in the stool. He also denies any problems with passing his water. He lives by himself but is constantly being overseen by his son.      Patient Active Problem List   Diagnosis Date Noted  . Dizziness and giddiness 10/05/2013  . Memory impairment 05/28/2013  . HYPERCHOLESTEROLEMIA 11/14/2009  . Essential hypertension 11/14/2009  . CORONARY ATHEROSCLEROSIS NATIVE CORONARY ARTERY 11/14/2009  . PVD 11/14/2009   Outpatient Encounter Prescriptions as of 09/28/2015  Medication Sig  . aspirin 81 MG tablet Take 81 mg by mouth daily.  . Cholecalciferol (VITAMIN D PO) Take 1 capsule by mouth daily.  Marland Kitchen docusate sodium (COLACE) 100 MG capsule Take 100 mg by mouth daily.  Marland Kitchen lisinopril (PRINIVIL,ZESTRIL) 2.5 MG tablet TAKE 1 TABLET (2.5 MG TOTAL) TWICE A DAY   No facility-administered encounter medications on file as of 09/28/2015.      Review of Systems  Constitutional: Negative.   HENT: Negative.   Eyes: Negative.   Respiratory: Negative.   Cardiovascular: Negative.   Gastrointestinal: Negative.   Endocrine: Negative.   Genitourinary: Negative.   Musculoskeletal: Negative.   Skin: Negative.   Allergic/Immunologic: Negative.   Neurological: Negative.        Off - balance  Hematological: Negative.   Psychiatric/Behavioral:  Positive for confusion.       Objective:   Physical Exam  Constitutional: He is oriented to person, place, and time. He appears well-developed and well-nourished. No distress.  Patient is smiling and pleasant. He does not know my name or his son's name. He is physically looking good.  HENT:  Head: Normocephalic and atraumatic.  Right Ear: External ear normal.  Left Ear: External ear normal.  Nose: Nose normal.  Mouth/Throat: Oropharynx is clear and moist. No oropharyngeal exudate.  Eyes: Conjunctivae and EOM are normal. Pupils are equal, round, and reactive to light. Right eye exhibits no discharge. Left eye exhibits no discharge. No scleral icterus.  Neck: Normal range of motion. Neck supple. No thyromegaly present.  Cardiovascular: Normal rate, regular rhythm and normal heart sounds.   No murmur heard. Pulmonary/Chest: Effort normal and breath sounds normal. No respiratory distress. He has no wheezes. He has no rales. He exhibits no tenderness.  Abdominal: Soft. Bowel sounds are normal. He exhibits no mass. There is no tenderness. There is no rebound and no guarding.  Musculoskeletal: Normal range of motion. He exhibits no edema.  Lymphadenopathy:    He has no cervical adenopathy.  Neurological: He is alert and oriented to person, place, and time.  Skin: Skin is warm and dry. No rash noted.  Psychiatric: He has a normal mood and affect. His behavior is normal.  He continues with his memory impairment. He admits this.  Nursing note and vitals reviewed.  BP (!) 153/79 (BP Location: Right Arm)  Pulse 89   Temp 97 F (36.1 C) (Oral)   Ht 5' 7"  (1.702 m)   Wt 140 lb (63.5 kg)   BMI 21.93 kg/m   [facility] reading (PRIMARY) by  Dr. Brunilda Payor x-ray with results pending                                        Assessment & Plan:  1. Essential hypertension -The blood pressure is slightly elevated on the systolic side. Attempts to lower this in the past have made the patient feel  worse and we will not make any further changes other than the current treatment he is now taking. - BMP8+EGFR - CBC with Differential/Platelet - Hepatic function panel - DG Chest 2 View; Future  2. Memory impairment -His memory impairment continues. He does not know the name of his son today. In fact his son says he sees him as 3 different people. One who goes to the bite one that provides care and one that does other things. -Attempts have been made in the past to try Aricept and he did not tolerate this at all. - CBC with Differential/Platelet  3. HYPERCHOLESTEROLEMIA -Continue with diet and exercise as much as possible - CBC with Differential/Platelet  4. Vitamin D deficiency - CBC with Differential/Platelet  5. ASCVD (arteriosclerotic cardiovascular disease) -Continue with diet and exercise as much as possible -Allow the patient to eat the foods and things that he likes so that he can stay as healthy as possible - CBC with Differential/Platelet - DG Chest 2 View; Future  6. BPH (benign prostatic hyperplasia) -No complaints with this today. - CBC with Differential/Platelet  Patient Instructions                       Medicare Annual Wellness Visit  [facility] and the medical providers at [facility] strive to bring you the best medical care.  In doing so we not only want to address your current medical conditions and concerns but also to detect new conditions early and prevent illness, disease and health-related problems.    Medicare offers a yearly Wellness Visit which allows our clinical staff to assess your need for preventative services including immunizations, lifestyle education, counseling to decrease risk of preventable diseases and screening for fall risk and other medical concerns.    This visit is provided free of charge (no copay) for all Medicare recipients. The clinical pharmacists at [facility] have begun to conduct  these Wellness Visits which will also include a thorough review of all your medications.    As you primary medical provider recommend that you make an appointment for your Annual Wellness Visit if you have not done so already this year.  You may set up this appointment before you leave today or you may call back ([phone]) and schedule an appointment.  Please make sure when you call that you mention that you are scheduling your Annual Wellness Visit with the clinical pharmacist so that the appointment may be made for the proper length of time.     Continue current medications. Continue good therapeutic lifestyle changes which include good diet and exercise. Fall precautions discussed with patient. If an FOBT was given today- please return it to our front desk. If you are over 12 years old - you may need Prevnar 20 or the adult Pneumonia vaccine.  After your visit with [place] today you will receive a survey in the mail or online from [employer] regarding your care with [place]. Please take a moment to fill this out. Your feedback is very important to [place] as you can help [place] better understand your patient needs as well as improve your experience and satisfaction. WE CARE ABOUT YOU!!!   The son will continue to monitor his father regularly He has not driven since the last visit which is a good thing He seems to have good care from the son at home. His weight is stable. We will continue with his current treatment with close supervision by the son.  Arrie Senate MD

## 2015-09-29 ENCOUNTER — Telehealth: Payer: Self-pay | Admitting: Family Medicine

## 2015-09-29 LAB — CBC WITH DIFFERENTIAL/PLATELET
BASOS: 1 %
Basophils Absolute: 0.1 10*3/uL (ref 0.0–0.2)
EOS (ABSOLUTE): 0.3 10*3/uL (ref 0.0–0.4)
EOS: 3 %
HEMATOCRIT: 36.4 % — AB (ref 37.5–51.0)
HEMOGLOBIN: 11.6 g/dL — AB (ref 12.6–17.7)
Immature Grans (Abs): 0 10*3/uL (ref 0.0–0.1)
Immature Granulocytes: 0 %
LYMPHS ABS: 2.3 10*3/uL (ref 0.7–3.1)
Lymphs: 28 %
MCH: 28.9 pg (ref 26.6–33.0)
MCHC: 31.9 g/dL (ref 31.5–35.7)
MCV: 91 fL (ref 79–97)
MONOCYTES: 14 %
MONOS ABS: 1.1 10*3/uL — AB (ref 0.1–0.9)
NEUTROS ABS: 4.6 10*3/uL (ref 1.4–7.0)
Neutrophils: 54 %
Platelets: 374 10*3/uL (ref 150–379)
RBC: 4.02 x10E6/uL — AB (ref 4.14–5.80)
RDW: 13.6 % (ref 12.3–15.4)
WBC: 8.4 10*3/uL (ref 3.4–10.8)

## 2015-09-29 LAB — HEPATIC FUNCTION PANEL
ALBUMIN: 3.7 g/dL (ref 3.2–4.6)
ALK PHOS: 110 IU/L (ref 39–117)
ALT: 10 IU/L (ref 0–44)
AST: 13 IU/L (ref 0–40)
Bilirubin Total: <0.2 mg/dL (ref 0.0–1.2)
Bilirubin, Direct: 0.07 mg/dL (ref 0.00–0.40)
Total Protein: 6.5 g/dL (ref 6.0–8.5)

## 2015-09-29 LAB — BMP8+EGFR
BUN / CREAT RATIO: 20 (ref 10–24)
BUN: 24 mg/dL (ref 10–36)
CO2: 24 mmol/L (ref 18–29)
CREATININE: 1.21 mg/dL (ref 0.76–1.27)
Calcium: 9.2 mg/dL (ref 8.6–10.2)
Chloride: 103 mmol/L (ref 96–106)
GFR, EST AFRICAN AMERICAN: 61 mL/min/{1.73_m2} (ref >59)
GFR, EST NON AFRICAN AMERICAN: 52 mL/min/{1.73_m2} — AB (ref >59)
GLUCOSE: 89 mg/dL (ref 65–99)
Potassium: 4.8 mmol/L (ref 3.5–5.2)
SODIUM: 141 mmol/L (ref 134–144)

## 2015-10-03 NOTE — Telephone Encounter (Signed)
Patient's son aware of results.

## 2015-12-05 ENCOUNTER — Other Ambulatory Visit: Payer: Self-pay | Admitting: Family Medicine

## 2015-12-28 ENCOUNTER — Telehealth: Payer: Self-pay | Admitting: Family Medicine

## 2016-01-04 NOTE — Telephone Encounter (Signed)
I called the patient's home number and he answered the phone and had no complaints. He said no one was with him at the time. He denied any fever or trouble passing his water or GI problems. We will try to call his son again in the morning.

## 2016-01-04 NOTE — Telephone Encounter (Signed)
Several attempt to contact Emergency contact Frank Fleming and no call back. I have tried to call several times this morning and the line has been busy. Do you know anything about this?

## 2016-01-11 ENCOUNTER — Ambulatory Visit (INDEPENDENT_AMBULATORY_CARE_PROVIDER_SITE_OTHER): Payer: Medicare Other | Admitting: *Deleted

## 2016-01-11 DIAGNOSIS — Z23 Encounter for immunization: Secondary | ICD-10-CM | POA: Diagnosis not present

## 2016-01-30 ENCOUNTER — Telehealth: Payer: Self-pay | Admitting: *Deleted

## 2016-01-30 NOTE — Telephone Encounter (Signed)
Spoke with pt's son to inform  due to pt's confusion and slurrred speech that although resolved per pt's son, Dr Christell ConstantMoore reccomends that pt have head CT.  If CT is clear, per Dr Christell ConstantMoore, pt should increase ASA to 325mg . Son will discuss with pt and call back

## 2016-02-21 ENCOUNTER — Ambulatory Visit: Payer: Medicare Other | Admitting: Family Medicine

## 2016-03-03 ENCOUNTER — Other Ambulatory Visit: Payer: Self-pay | Admitting: Family Medicine

## 2016-03-29 ENCOUNTER — Ambulatory Visit: Payer: Medicare Other | Admitting: Family Medicine

## 2016-04-04 ENCOUNTER — Ambulatory Visit (INDEPENDENT_AMBULATORY_CARE_PROVIDER_SITE_OTHER): Payer: Medicare Other | Admitting: Family Medicine

## 2016-04-04 ENCOUNTER — Encounter: Payer: Self-pay | Admitting: Family Medicine

## 2016-04-04 VITALS — BP 132/62 | HR 87 | Temp 96.6°F | Ht 67.0 in | Wt 132.0 lb

## 2016-04-04 DIAGNOSIS — J069 Acute upper respiratory infection, unspecified: Secondary | ICD-10-CM

## 2016-04-04 DIAGNOSIS — L209 Atopic dermatitis, unspecified: Secondary | ICD-10-CM | POA: Diagnosis not present

## 2016-04-04 MED ORDER — METHYLPREDNISOLONE ACETATE 80 MG/ML IJ SUSP: 60.0000 mg | Freq: Once | INTRAMUSCULAR | Status: DC

## 2016-04-04 MED ORDER — CEPHALEXIN 500 MG PO CAPS: 500.0000 mg | ORAL_CAPSULE | Freq: Three times a day (TID) | ORAL | 0 refills | Status: DC

## 2016-04-04 NOTE — Patient Instructions (Addendum)
Use Lubriderm alternating with baby oil Avoid scented fabric softeners in the dryer Always use detergents and soaps that are scent free Take antibiotic until completed Call us with progress in one week If Any kind of compresses are needed cool wet compresses would be better than hot compresses. Continue to drink plenty of fluids and stay well hydrated

## 2016-04-04 NOTE — Progress Notes (Signed)
Subjective:    Patient ID: Frank Fleming, male    DOB: 04/04/1923, 83 y.o.   MRN: [id number]  HPI Patient here today for scaly / dry rash and sinus drainage.All of this rash is on his anterior trunk. The patient has a history of applying lotions and things that are not appropriate. He is had congestion for several days and occasionally it has been green in color. He is not running a fever.     Patient Active Problem List   Diagnosis Date Noted  . Dizziness and giddiness 10/05/2013  . Memory impairment 05/28/2013  . HYPERCHOLESTEROLEMIA 11/14/2009  . Essential hypertension 11/14/2009  . CORONARY ATHEROSCLEROSIS NATIVE CORONARY ARTERY 11/14/2009  . PVD 11/14/2009   Outpatient Encounter Prescriptions as of 04/04/2016  Medication Sig  . aspirin 81 MG tablet Take 81 mg by mouth daily.  . Cholecalciferol (VITAMIN D PO) Take 1 capsule by mouth daily.  Marland Kitchen. docusate sodium (COLACE) 100 MG capsule Take 100 mg by mouth daily.  Marland Kitchen. lisinopril (PRINIVIL,ZESTRIL) 2.5 MG tablet TAKE 1 TABLET (2.5 MG TOTAL) TWICE A DAY  . [DISCONTINUED] lisinopril (PRINIVIL,ZESTRIL) 2.5 MG tablet TAKE 1 TABLET (2.5 MG TOTAL) BY MOUTH DAILY.   No facility-administered encounter medications on file as of 04/04/2016.       Review of Systems  Constitutional: Negative.   HENT: Negative.   Eyes: Negative.   Respiratory: Negative.   Cardiovascular: Negative.   Gastrointestinal: Negative.   Endocrine: Negative.   Genitourinary: Negative.   Musculoskeletal: Negative.   Skin: Positive for rash (very dry and scaly).  Allergic/Immunologic: Negative.   Neurological: Negative.   Hematological: Negative.   Psychiatric/Behavioral: Negative.        Objective:   Physical Exam  Constitutional: He appears well-developed and well-nourished. No distress.  Elderly man with dementia  HENT:  Head: Normocephalic and atraumatic.  Right Ear: External ear normal.  Left Ear: External ear normal.  Mouth/Throat: Oropharynx is  clear and moist. No oropharyngeal exudate.  Nasal congestion with clear drainage  Eyes: Conjunctivae and EOM are normal. Pupils are equal, round, and reactive to light. Right eye exhibits no discharge. Left eye exhibits no discharge. No scleral icterus.  Neck: Normal range of motion. Neck supple. No thyromegaly present.  Cardiovascular: Normal rate, regular rhythm and normal heart sounds.   No murmur heard. Pulmonary/Chest: Effort normal and breath sounds normal. No respiratory distress. He has no wheezes. He has no rales.  Clear anteriorly and posteriorly  Musculoskeletal: Normal range of motion. He exhibits no edema.  Lymphadenopathy:    He has no cervical adenopathy.  Neurological: He is alert. No cranial nerve deficit.  Skin: Skin is warm and dry. Rash noted. There is erythema. No pallor.  Psychiatric: He has a normal mood and affect. His behavior is normal. Thought content normal.  Nursing note and vitals reviewed.  BP 132/62 (BP Location: Left Arm)   Pulse 87   Temp (!) 96.6 F (35.9 C) (Oral)   Ht 5\' 7"  (1.702 m)   Wt 132 lb (59.9 kg)   BMI 20.67 kg/m         Assessment & Plan:  1. Atopic dermatitis, unspecified type -The patient's son will monitor his father closely and make sure that he takes antibiotics for the skin irritation and infection and that he takes these appropriately and on time. -He will make sure that he use scent free soaps detergents and fabric softeners. -He will take Keflex 503 times a day for 10 days -  Severe anterior chest wall and abdomen dermatitis. - methylPREDNISolone acetate (DEPO-MEDROL) injection 60 mg; Inject 0.75 mLs (60 mg total) into the muscle once.  2. Upper respiratory tract infection, unspecified type -Take Keflex 500 3 times a day for 10 days -Call in one week for progress  Patient Instructions  Use Lubriderm alternating with baby oil Avoid scented fabric softeners in the dryer Always use detergents and soaps that are scent  free Take antibiotic until completed Call us with progress in one week If Any kind of compresses are needed cool wet compresses would be better than hot compresses. Continue to drink plenty of fluids and stay well hydrated  Nyra Capes MD

## 2016-04-06 ENCOUNTER — Other Ambulatory Visit: Payer: Self-pay | Admitting: Family Medicine

## 2016-04-23 ENCOUNTER — Telehealth: Payer: Self-pay | Admitting: Family Medicine

## 2016-07-24 ENCOUNTER — Encounter (HOSPITAL_COMMUNITY): Payer: Self-pay | Admitting: *Deleted

## 2016-07-24 ENCOUNTER — Emergency Department (HOSPITAL_COMMUNITY)
Admission: EM | Admit: 2016-07-24 | Discharge: 2016-07-24 | Disposition: A | Discharge status: Home / Self Care | Payer: Medicare Other | Attending: Emergency Medicine | Admitting: Emergency Medicine

## 2016-07-24 DIAGNOSIS — D688 Other specified coagulation defects: Secondary | ICD-10-CM | POA: Diagnosis not present

## 2016-07-24 DIAGNOSIS — N401 Enlarged prostate with lower urinary tract symptoms: Secondary | ICD-10-CM | POA: Diagnosis not present

## 2016-07-24 DIAGNOSIS — Z743 Need for continuous supervision: Secondary | ICD-10-CM | POA: Diagnosis not present

## 2016-07-24 DIAGNOSIS — E119 Type 2 diabetes mellitus without complications: Secondary | ICD-10-CM | POA: Diagnosis not present

## 2016-07-24 DIAGNOSIS — F0390 Unspecified dementia without behavioral disturbance: Secondary | ICD-10-CM | POA: Insufficient documentation

## 2016-07-24 DIAGNOSIS — I739 Peripheral vascular disease, unspecified: Secondary | ICD-10-CM | POA: Diagnosis not present

## 2016-07-24 DIAGNOSIS — Z7982 Long term (current) use of aspirin: Secondary | ICD-10-CM | POA: Insufficient documentation

## 2016-07-24 DIAGNOSIS — G4700 Insomnia, unspecified: Secondary | ICD-10-CM | POA: Diagnosis not present

## 2016-07-24 DIAGNOSIS — K5900 Constipation, unspecified: Secondary | ICD-10-CM | POA: Diagnosis not present

## 2016-07-24 DIAGNOSIS — D649 Anemia, unspecified: Secondary | ICD-10-CM | POA: Diagnosis not present

## 2016-07-24 DIAGNOSIS — I10 Essential (primary) hypertension: Secondary | ICD-10-CM | POA: Diagnosis not present

## 2016-07-24 DIAGNOSIS — Z79899 Other long term (current) drug therapy: Secondary | ICD-10-CM | POA: Diagnosis not present

## 2016-07-24 DIAGNOSIS — R05 Cough: Secondary | ICD-10-CM | POA: Diagnosis not present

## 2016-07-24 DIAGNOSIS — R0989 Other specified symptoms and signs involving the circulatory and respiratory systems: Secondary | ICD-10-CM | POA: Diagnosis not present

## 2016-07-24 DIAGNOSIS — E782 Mixed hyperlipidemia: Secondary | ICD-10-CM | POA: Diagnosis not present

## 2016-07-24 DIAGNOSIS — R279 Unspecified lack of coordination: Secondary | ICD-10-CM | POA: Diagnosis not present

## 2016-07-24 DIAGNOSIS — Z87891 Personal history of nicotine dependence: Secondary | ICD-10-CM | POA: Diagnosis not present

## 2016-07-24 DIAGNOSIS — M6281 Muscle weakness (generalized): Secondary | ICD-10-CM | POA: Diagnosis not present

## 2016-07-24 DIAGNOSIS — I70209 Unspecified atherosclerosis of native arteries of extremities, unspecified extremity: Secondary | ICD-10-CM | POA: Diagnosis not present

## 2016-07-24 DIAGNOSIS — E039 Hypothyroidism, unspecified: Secondary | ICD-10-CM | POA: Diagnosis not present

## 2016-07-24 DIAGNOSIS — I2510 Atherosclerotic heart disease of native coronary artery without angina pectoris: Secondary | ICD-10-CM | POA: Diagnosis not present

## 2016-07-24 DIAGNOSIS — D518 Other vitamin B12 deficiency anemias: Secondary | ICD-10-CM | POA: Diagnosis not present

## 2016-07-24 DIAGNOSIS — E7800 Pure hypercholesterolemia, unspecified: Secondary | ICD-10-CM | POA: Diagnosis not present

## 2016-07-24 DIAGNOSIS — R451 Restlessness and agitation: Secondary | ICD-10-CM | POA: Diagnosis present

## 2016-07-24 NOTE — Discharge Instructions (Signed)
Patient stable for admission to nursing facility. Patient now no longer agitated and is cooperative. [facility]

## 2016-07-24 NOTE — ED Notes (Signed)
Writer spoke to [place]anceboro[place]annon, LPN admissions coordinator [facility], states they have no transport available. States patient will be in the care of [facility] when he arrives.

## 2016-07-24 NOTE — ED Triage Notes (Signed)
Pt brought in by [facility] from home. Pt lives with his son and his son had a massive stroke yesterday and is in the hospital. Pt's other son doesn't live locally and got pt accepted into that facility and he was going to be transported [facility] today by family today due to son not being at home to care for him anymore. EMS was called because pt started getting combative when family was trying to transport him to the facility. Pt arrives to ED in calm state, somewhat confused, hx of dementia.

## 2016-07-24 NOTE — ED Notes (Signed)
Patient given dinner tray.

## 2016-07-24 NOTE — ED Notes (Signed)
ED Provider at bedside. 

## 2016-07-24 NOTE — ED Provider Notes (Signed)
[facility]-EMERGENCY DEPT Provider Note   CSN: [id number] Arrival date & time: 07/24/16  1724     History   Chief Complaint Chief Complaint  Patient presents with  . Agitation    HPI Frank Fleming is a 83 y.o. male.  Patient was picked up by EMS to take him from his home to his new nursing facility [facility]. Patient was very agitated and nursing home would not accept him. Patient normally has been cared for by his son but unfortunately his son had a massive stroke yesterday and is in the hospital. Does not have other family members locally they arranged for him to be a admitted to [facility]. EMS was there to transport him. Patient became very agitated.  Upon arrival here patient is very calm and cooperative. Patient does have a history of dementia.      Past Medical History:  Diagnosis Date  . Arthritis   . CAD (coronary artery disease)    Stent PCI RCA occlusion 2003.  Cypher  . Colon polyp   . Diverticulosis of colon   . Dizziness and giddiness 10/05/2013  . Hyperlipidemia   . Hypertension   . Knee pain   . Memory impairment     Patient Active Problem List   Diagnosis Date Noted  . Dizziness and giddiness 10/05/2013  . Memory impairment 05/28/2013  . HYPERCHOLESTEROLEMIA 11/14/2009  . Essential hypertension 11/14/2009  . CORONARY ATHEROSCLEROSIS NATIVE CORONARY ARTERY 11/14/2009  . PVD 11/14/2009    Past Surgical History:  Procedure Laterality Date  . ANGIOPLASTY    . APPENDECTOMY    . ENDARTERECTOMY FEMORAL    . HERNIA REPAIR     right inguinal  . TONSILLECTOMY         Home Medications    Prior to Admission medications   Medication Sig Start Date End Date Taking? Authorizing Provider  aspirin 81 MG tablet Take 81 mg by mouth daily.    [provider]  cephALEXin (KEFLEX) 500 MG capsule Take 1 capsule (500 mg total) by mouth 3 (three) times daily. 04/04/16   Ernestina Penna, MD  Cholecalciferol (VITAMIN D PO) Take 1 capsule by mouth  daily.    [provider]  docusate sodium (COLACE) 100 MG capsule Take 100 mg by mouth daily.    [provider]  lisinopril (PRINIVIL,ZESTRIL) 2.5 MG tablet TAKE 1 TABLET (2.5 MG TOTAL) TWICE A DAY 02/07/15   Ernestina Penna, MD  lisinopril (PRINIVIL,ZESTRIL) 2.5 MG tablet TAKE 1 TABLET (2.5 MG TOTAL) BY MOUTH DAILY. 04/06/16   Ernestina Penna, MD    Family History Family History  Problem Relation Age of Onset  . Leukemia Sister     Social History Social History  Substance Use Topics  . Smoking status: Former Smoker    Packs/day: 1.00    Years: 15.00    Types: Cigarettes  . Smokeless tobacco: Former Neurosurgeon    Types: Chew     Comment: Quit tobacco 40 years ago.   . Alcohol use No     Allergies   Penicillins and Zocor [simvastatin]   Review of Systems Review of Systems  Unable to perform ROS: Dementia     Physical Exam Updated Vital Signs BP (!) 149/57 (BP Location: Right Arm)   Pulse 73   Temp 98 F (36.7 C) (Oral)   Resp 18   Ht 1.702 m (5\' 7" )   Wt 59.9 kg (132 lb)   SpO2 100%   BMI 20.67  kg/m   Physical Exam  Constitutional: He appears well-developed and well-nourished. No distress.  HENT:  Head: Normocephalic and atraumatic.  Mouth/Throat: Oropharynx is clear and moist.  Eyes: Conjunctivae and EOM are normal. Pupils are equal, round, and reactive to light.  Neck: Normal range of motion. Neck supple.  Cardiovascular: Normal rate, regular rhythm and normal heart sounds.   Pulmonary/Chest: Effort normal and breath sounds normal.  Abdominal: Soft. Bowel sounds are normal. There is no tenderness.  Musculoskeletal: Normal range of motion.  Neurological: He is alert. No cranial nerve deficit or sensory deficit. He exhibits normal muscle tone. Coordination normal.  Patient alert and will follow commands.  Skin: Skin is warm.  Nursing note and vitals reviewed.    ED Treatments / Results  Labs (all labs ordered are listed, but only abnormal  results are displayed) Labs Reviewed - No data to display  EKG  EKG Interpretation None       Radiology No results found.  Procedures Procedures (including critical care time)  Medications Ordered in ED Medications - No data to display   Initial Impression / Assessment and Plan / ED Course  I have reviewed the triage vital signs and the nursing notes.  Pertinent labs & imaging results that were available during my care of the patient were reviewed by me and considered in my medical decision making (see chart for details).    Patient is calm and cooperative currently. Patient will follow commands. Patient does appear to have the evidence of dementia as per his past medical history. EMS wanted to give patient Haldol to get him into the nursing facility wanted him evaluated here just to see what was going on. Patient is now calm and stable for admission to nursing facility. Patient was not given any medication to calm him down.   Final Clinical Impressions(s) / ED Diagnoses   Final diagnoses:  Dementia without behavioral disturbance, unspecified dementia type    New Prescriptions New Prescriptions   No medications on file     Vanetta MuldersZackowski, Jamale Spangler, MD 07/24/16 [phone]

## 2016-07-24 NOTE — ED Notes (Signed)
[facility]

## 2016-07-31 DIAGNOSIS — I10 Essential (primary) hypertension: Secondary | ICD-10-CM | POA: Diagnosis not present

## 2016-07-31 DIAGNOSIS — M6281 Muscle weakness (generalized): Secondary | ICD-10-CM | POA: Diagnosis not present

## 2016-08-03 DIAGNOSIS — I10 Essential (primary) hypertension: Secondary | ICD-10-CM | POA: Diagnosis not present

## 2016-08-03 DIAGNOSIS — N401 Enlarged prostate with lower urinary tract symptoms: Secondary | ICD-10-CM | POA: Diagnosis not present

## 2016-08-03 DIAGNOSIS — M6281 Muscle weakness (generalized): Secondary | ICD-10-CM | POA: Diagnosis not present

## 2016-09-06 DIAGNOSIS — M7150 Other bursitis, not elsewhere classified, unspecified site: Secondary | ICD-10-CM | POA: Diagnosis not present

## 2016-09-06 DIAGNOSIS — I739 Peripheral vascular disease, unspecified: Secondary | ICD-10-CM | POA: Diagnosis not present

## 2016-09-11 DIAGNOSIS — E46 Unspecified protein-calorie malnutrition: Secondary | ICD-10-CM | POA: Diagnosis not present

## 2016-09-27 DIAGNOSIS — M6281 Muscle weakness (generalized): Secondary | ICD-10-CM | POA: Diagnosis not present

## 2016-09-28 DIAGNOSIS — M6281 Muscle weakness (generalized): Secondary | ICD-10-CM | POA: Diagnosis not present

## 2016-10-01 DIAGNOSIS — M6281 Muscle weakness (generalized): Secondary | ICD-10-CM | POA: Diagnosis not present

## 2016-10-02 DIAGNOSIS — M6281 Muscle weakness (generalized): Secondary | ICD-10-CM | POA: Diagnosis not present

## 2016-10-03 DIAGNOSIS — M6281 Muscle weakness (generalized): Secondary | ICD-10-CM | POA: Diagnosis not present

## 2016-10-04 DIAGNOSIS — M6281 Muscle weakness (generalized): Secondary | ICD-10-CM | POA: Diagnosis not present

## 2016-10-05 DIAGNOSIS — M6281 Muscle weakness (generalized): Secondary | ICD-10-CM | POA: Diagnosis not present

## 2016-10-08 DIAGNOSIS — M6281 Muscle weakness (generalized): Secondary | ICD-10-CM | POA: Diagnosis not present

## 2016-10-09 DIAGNOSIS — H10403 Unspecified chronic conjunctivitis, bilateral: Secondary | ICD-10-CM | POA: Diagnosis not present

## 2016-10-09 DIAGNOSIS — M6281 Muscle weakness (generalized): Secondary | ICD-10-CM | POA: Diagnosis not present

## 2016-10-10 DIAGNOSIS — M6281 Muscle weakness (generalized): Secondary | ICD-10-CM | POA: Diagnosis not present

## 2016-10-11 DIAGNOSIS — M6281 Muscle weakness (generalized): Secondary | ICD-10-CM | POA: Diagnosis not present

## 2016-10-12 DIAGNOSIS — M6281 Muscle weakness (generalized): Secondary | ICD-10-CM | POA: Diagnosis not present

## 2016-10-12 DIAGNOSIS — E782 Mixed hyperlipidemia: Secondary | ICD-10-CM | POA: Diagnosis not present

## 2016-10-12 DIAGNOSIS — I739 Peripheral vascular disease, unspecified: Secondary | ICD-10-CM | POA: Diagnosis not present

## 2016-10-15 DIAGNOSIS — M6281 Muscle weakness (generalized): Secondary | ICD-10-CM | POA: Diagnosis not present

## 2016-10-16 DIAGNOSIS — M6281 Muscle weakness (generalized): Secondary | ICD-10-CM | POA: Diagnosis not present

## 2016-10-17 DIAGNOSIS — M6281 Muscle weakness (generalized): Secondary | ICD-10-CM | POA: Diagnosis not present

## 2016-10-18 DIAGNOSIS — M6281 Muscle weakness (generalized): Secondary | ICD-10-CM | POA: Diagnosis not present

## 2016-10-19 DIAGNOSIS — M6281 Muscle weakness (generalized): Secondary | ICD-10-CM | POA: Diagnosis not present

## 2016-11-06 DIAGNOSIS — K5900 Constipation, unspecified: Secondary | ICD-10-CM | POA: Diagnosis not present

## 2016-11-06 DIAGNOSIS — N183 Chronic kidney disease, stage 3 (moderate): Secondary | ICD-10-CM | POA: Diagnosis not present

## 2016-11-06 DIAGNOSIS — I119 Hypertensive heart disease without heart failure: Secondary | ICD-10-CM | POA: Diagnosis not present

## 2016-11-29 DIAGNOSIS — E46 Unspecified protein-calorie malnutrition: Secondary | ICD-10-CM | POA: Diagnosis not present

## 2016-11-29 DIAGNOSIS — K5900 Constipation, unspecified: Secondary | ICD-10-CM | POA: Diagnosis not present

## 2016-11-29 DIAGNOSIS — H01009 Unspecified blepharitis unspecified eye, unspecified eyelid: Secondary | ICD-10-CM | POA: Diagnosis not present

## 2016-12-20 DIAGNOSIS — Z23 Encounter for immunization: Secondary | ICD-10-CM | POA: Diagnosis not present

## 2017-01-02 DIAGNOSIS — N390 Urinary tract infection, site not specified: Secondary | ICD-10-CM | POA: Diagnosis not present

## 2017-01-02 DIAGNOSIS — D649 Anemia, unspecified: Secondary | ICD-10-CM | POA: Diagnosis not present

## 2017-01-03 DIAGNOSIS — R5381 Other malaise: Secondary | ICD-10-CM | POA: Diagnosis not present

## 2017-01-03 DIAGNOSIS — R0602 Shortness of breath: Secondary | ICD-10-CM | POA: Diagnosis not present

## 2017-01-03 DIAGNOSIS — N183 Chronic kidney disease, stage 3 (moderate): Secondary | ICD-10-CM | POA: Diagnosis not present

## 2017-01-03 DIAGNOSIS — R682 Dry mouth, unspecified: Secondary | ICD-10-CM | POA: Diagnosis not present

## 2017-01-04 DIAGNOSIS — M6281 Muscle weakness (generalized): Secondary | ICD-10-CM | POA: Diagnosis not present

## 2017-01-07 DIAGNOSIS — M6281 Muscle weakness (generalized): Secondary | ICD-10-CM | POA: Diagnosis not present

## 2017-01-08 DIAGNOSIS — M6281 Muscle weakness (generalized): Secondary | ICD-10-CM | POA: Diagnosis not present

## 2017-01-09 ENCOUNTER — Emergency Department (HOSPITAL_COMMUNITY): Payer: Medicare Other

## 2017-01-09 ENCOUNTER — Emergency Department (HOSPITAL_COMMUNITY)
Admission: EM | Admit: 2017-01-09 | Discharge: 2017-01-09 | Disposition: A | Discharge status: Home / Self Care | Payer: Medicare Other | Attending: Emergency Medicine | Admitting: Emergency Medicine

## 2017-01-09 ENCOUNTER — Encounter (HOSPITAL_COMMUNITY): Payer: Self-pay

## 2017-01-09 DIAGNOSIS — R4182 Altered mental status, unspecified: Secondary | ICD-10-CM | POA: Diagnosis not present

## 2017-01-09 DIAGNOSIS — F0390 Unspecified dementia without behavioral disturbance: Secondary | ICD-10-CM | POA: Insufficient documentation

## 2017-01-09 DIAGNOSIS — I10 Essential (primary) hypertension: Secondary | ICD-10-CM | POA: Insufficient documentation

## 2017-01-09 DIAGNOSIS — I2510 Atherosclerotic heart disease of native coronary artery without angina pectoris: Secondary | ICD-10-CM | POA: Insufficient documentation

## 2017-01-09 DIAGNOSIS — Z91010 Allergy to peanuts: Secondary | ICD-10-CM | POA: Diagnosis not present

## 2017-01-09 DIAGNOSIS — R404 Transient alteration of awareness: Secondary | ICD-10-CM | POA: Diagnosis not present

## 2017-01-09 DIAGNOSIS — Z79899 Other long term (current) drug therapy: Secondary | ICD-10-CM | POA: Diagnosis not present

## 2017-01-09 DIAGNOSIS — R402411 Glasgow coma scale score 13-15, in the field [EMT or ambulance]: Secondary | ICD-10-CM | POA: Diagnosis not present

## 2017-01-09 HISTORY — DX: Pneumonia, unspecified organism: J18.9

## 2017-01-09 HISTORY — DX: Constipation, unspecified: K59.00

## 2017-01-09 HISTORY — DX: Peripheral vascular disease, unspecified: I73.9

## 2017-01-09 HISTORY — DX: Pure hypercholesterolemia, unspecified: E78.00

## 2017-01-09 HISTORY — DX: Muscle weakness (generalized): M62.81

## 2017-01-09 HISTORY — DX: Unspecified dementia, unspecified severity, without behavioral disturbance, psychotic disturbance, mood disturbance, and anxiety: F03.90

## 2017-01-09 HISTORY — DX: Major depressive disorder, single episode, unspecified: F32.9

## 2017-01-09 HISTORY — DX: Dysphagia, unspecified: R13.10

## 2017-01-09 HISTORY — DX: Insomnia, unspecified: G47.00

## 2017-01-09 HISTORY — DX: Depression, unspecified: F32.A

## 2017-01-09 HISTORY — DX: Benign prostatic hyperplasia without lower urinary tract symptoms: N40.0

## 2017-01-09 LAB — CBC WITH DIFFERENTIAL/PLATELET
BASOS ABS: 0 10*3/uL (ref 0.0–0.1)
BASOS PCT: 0 %
Eosinophils Absolute: 0.3 10*3/uL (ref 0.0–0.7)
Eosinophils Relative: 3 %
HEMATOCRIT: 33.6 % — AB (ref 39.0–52.0)
Hemoglobin: 10.9 g/dL — ABNORMAL LOW (ref 13.0–17.0)
LYMPHS PCT: 18 %
Lymphs Abs: 1.9 10*3/uL (ref 0.7–4.0)
MCH: 29.8 pg (ref 26.0–34.0)
MCHC: 32.4 g/dL (ref 30.0–36.0)
MCV: 91.8 fL (ref 78.0–100.0)
Monocytes Absolute: 0.9 10*3/uL (ref 0.1–1.0)
Monocytes Relative: 8 %
NEUTROS ABS: 7.7 10*3/uL (ref 1.7–7.7)
Neutrophils Relative %: 71 %
PLATELETS: 330 10*3/uL (ref 150–400)
RBC: 3.66 MIL/uL — AB (ref 4.22–5.81)
RDW: 14 % (ref 11.5–15.5)
WBC: 10.8 10*3/uL — AB (ref 4.0–10.5)

## 2017-01-09 LAB — TROPONIN I: Troponin I: <0.03 ng/mL (ref <0.03)

## 2017-01-09 LAB — URINALYSIS, ROUTINE W REFLEX MICROSCOPIC
BILIRUBIN URINE: NEGATIVE
GLUCOSE, UA: NEGATIVE mg/dL
HGB URINE DIPSTICK: NEGATIVE
Ketones, ur: 5 mg/dL — AB
Leukocytes, UA: NEGATIVE
Nitrite: NEGATIVE
PH: 5 (ref 5.0–8.0)
Protein, ur: NEGATIVE mg/dL
SPECIFIC GRAVITY, URINE: 1.024 (ref 1.005–1.030)

## 2017-01-09 LAB — COMPREHENSIVE METABOLIC PANEL
ALBUMIN: 3 g/dL — AB (ref 3.5–5.0)
ALT: 30 U/L (ref 17–63)
AST: 27 U/L (ref 15–41)
Alkaline Phosphatase: 84 U/L (ref 38–126)
Anion gap: 10 (ref 5–15)
BUN: 38 mg/dL — AB (ref 6–20)
CHLORIDE: 109 mmol/L (ref 101–111)
CO2: 22 mmol/L (ref 22–32)
CREATININE: 1.33 mg/dL — AB (ref 0.61–1.24)
Calcium: 8.5 mg/dL — ABNORMAL LOW (ref 8.9–10.3)
GFR calc Af Amer: 52 mL/min — ABNORMAL LOW (ref >60)
GFR, EST NON AFRICAN AMERICAN: 45 mL/min — AB (ref >60)
GLUCOSE: 96 mg/dL (ref 65–99)
POTASSIUM: 4.4 mmol/L (ref 3.5–5.1)
Sodium: 141 mmol/L (ref 135–145)
Total Bilirubin: 0.4 mg/dL (ref 0.3–1.2)
Total Protein: 6 g/dL — ABNORMAL LOW (ref 6.5–8.1)

## 2017-01-09 LAB — CBG MONITORING, ED: GLUCOSE-CAPILLARY: 90 mg/dL (ref 65–99)

## 2017-01-09 LAB — MAGNESIUM: Magnesium: 2.2 mg/dL (ref 1.7–2.4)

## 2017-01-09 MED ORDER — SODIUM CHLORIDE 0.9 % IV BOLUS (SEPSIS)
1000.0000 mL | Freq: Once | INTRAVENOUS | Status: AC
  Administered 2017-01-09: 1000 mL via INTRAVENOUS

## 2017-01-09 NOTE — ED Notes (Signed)
Pt back from CT

## 2017-01-09 NOTE — ED Triage Notes (Addendum)
Per nursing home staff, pt has dementia, had episode of being unresponsive, had shallow belly breathing, rr 50, puils fixed, o2 sat 95%, cbg 170, temp 96.7, hr 44, bp 140/56.  Reports pt was not responsive to any stimuli.  Pt had rocephin IM and is a DNR.

## 2017-01-09 NOTE — ED Notes (Signed)
Tilda BurrowWanna Marley [phone] Niece

## 2017-01-09 NOTE — ED Provider Notes (Signed)
Emergency Department Provider Note   I have reviewed the triage vital signs and the nursing notes.   HISTORY  Chief Complaint Altered Mental Status   HPI Frank Fleming is a 83 y.o. male with severe dementia that is significant limiting his history presents to the emergency department with information from EMS.  Seems that the patient was in his normal state of health which is disoriented but alert and talkative.  They were doing routine vital signs this afternoon the patient was found to be unresponsive.  His heart rate was reportedly 44 and his respiratory rate was reportedly 50 with normal blood pressure and oxygen saturation of 95% and his pupils were fixed and unresponsive.  EMS was called and patient has returned to ears to be as normal self since that time.  Unknown what the glucose was.  Patient not able to offer any information on history. Seem to the patient was being treated recently for some type of infection.  It is unclear what the infection was as the urine was clean.  He also had received Rocephin at least one time.  Level V Caveat secondary to severe dementia.   Past Medical History:  Diagnosis Date  . Benign prostatic hyperplasia   . Constipation   . Coronary artery disease   . Dementia   . Depression   . Dysphagia   . Hypercholesterolemia   . Hypertension   . Insomnia   . Muscle weakness (generalized)   . Peripheral vascular disease (HCC)   . Pneumonia     There are no active problems to display for this patient.   History reviewed. No pertinent surgical history.    Allergies Peanut-containing drug products; Penicillins; and Zocor [simvastatin]  History reviewed. No pertinent family history.  Social History Social History   Tobacco Use  . Smoking status: Never Smoker  . Smokeless tobacco: Never Used  Substance Use Topics  . Alcohol use: No    Frequency: Never  . Drug use: No    Review of Systems  Level V Caveat secondary to severe  dementia. ____________________________________________   PHYSICAL EXAM:  VITAL SIGNS: ED Triage Vitals [01/09/17 1458]  Enc Vitals Group     BP (!) 154/57     Pulse Rate 83     Resp 20     Temp 97.7 F (36.5 C)     Temp Source Oral     SpO2 97 %    Constitutional: Alert, oriented to self. Well appearing and in no acute distress. Eyes: Conjunctivae are normal. PERRL. EOMI. Head: Atraumatic. Nose: No congestion/rhinnorhea. Mouth/Throat: Mucous membranes are moist.  Oropharynx non-erythematous. Neck: No stridor.  No meningeal signs.   Cardiovascular: Normal rate, regular rhythm. Good peripheral circulation. Grossly normal heart sounds.   Respiratory: Normal respiratory effort.  No retractions. Lungs CTAB. Gastrointestinal: Soft and nontender. No distention.  Musculoskeletal: No lower extremity tenderness nor edema. No gross deformities of extremities. Neurologic:  Normal speech and language. No gross focal neurologic deficits are appreciated.  Skin:  Skin is warm, dry and intact. No rash noted.   ____________________________________________   LABS (all labs ordered are listed, but only abnormal results are displayed)  Labs Reviewed  CBC WITH DIFFERENTIAL/PLATELET - Abnormal; Notable for the following components:      Result Value   WBC 10.8 (*)    RBC 3.66 (*)    Hemoglobin 10.9 (*)    HCT 33.6 (*)    All other components within normal limits  COMPREHENSIVE  METABOLIC PANEL - Abnormal; Notable for the following components:   BUN 38 (*)    Creatinine, Ser 1.33 (*)    Calcium 8.5 (*)    Total Protein 6.0 (*)    Albumin 3.0 (*)    GFR calc non Af Amer 45 (*)    GFR calc Af Amer 52 (*)    All other components within normal limits  URINALYSIS, ROUTINE W REFLEX MICROSCOPIC - Abnormal; Notable for the following components:   Ketones, ur 5 (*)    All other components within normal limits  TROPONIN I  MAGNESIUM  CBG MONITORING, ED    ____________________________________________  EKG   EKG Interpretation  Date/Time:  Wednesday January 09 2017 15:01:37 EST Ventricular Rate:  83 PR Interval:    QRS Duration: 137 QT Interval:  417 QTC Calculation: 490 R Axis:   54 Text Interpretation:  Sinus rhythm Right bundle branch block No old tracing to compare Confirmed by Marily MemosMesner, Leeyah Heather [phone]([id number]) on 01/09/2017 3:38:45 PM       ____________________________________________  RADIOLOGY  Dg Chest 2 View  Result Date: 01/09/2017 CLINICAL DATA:  Syncope. EXAM: CHEST  2 VIEW COMPARISON:  09/28/2015. FINDINGS: The cardiomediastinal silhouette is within normal limits. The lungs are slightly less well inflated than on the prior study with minimal bibasilar atelectasis. No lobar consolidation, edema, pleural effusion, or pneumothorax is identified. Advanced degenerative changes are noted at the shoulders. Aortic atherosclerosis is noted. IMPRESSION: No active cardiopulmonary disease. Electronically Signed   By: Sebastian AcheAllen  Grady M.D.   On: 01/09/2017 16:40   Ct Head Wo Contrast  Result Date: 01/09/2017 CLINICAL DATA:  Dementia, unresponsive. EXAM: CT HEAD WITHOUT CONTRAST TECHNIQUE: Contiguous axial images were obtained from the base of the skull through the vertex without intravenous contrast. COMPARISON:  None. FINDINGS: BRAIN: There is sulcal and ventricular prominence consistent with superficial and central atrophy. No intraparenchymal hemorrhage, mass effect nor midline shift. Periventricular and subcortical white matter hypodensities consistent with chronic small vessel ischemic disease are identified. No acute large vascular territory infarcts. No abnormal extra-axial fluid collections. Basal cisterns are not effaced and midline. VASCULAR: Moderate calcific atherosclerosis of the carotid siphons. SKULL: No skull fracture. No significant scalp soft tissue swelling. SINUSES/ORBITS: The mastoid air-cells are clear. The included paranasal  sinuses are well-aerated.The included ocular globes and orbital contents are non-suspicious. OTHER: None. IMPRESSION: Atrophy with chronic appearing small vessel ischemia. No acute intracranial abnormality. Electronically Signed   By: Tollie Ethavid  Kwon M.D.   On: 01/09/2017 16:44    ____________________________________________   PROCEDURES  Procedure(s) performed:   Procedures   ____________________________________________   INITIAL IMPRESSION / ASSESSMENT AND PLAN / ED COURSE  Pertinent labs & imaging results that were available during my care of the patient were reviewed by me and considered in my medical decision making (see chart for details).  Apparent episode of syncope.  No red flags on his past medical history.  Patient does not remember the event however may have had a heart rate in the 40s.  He is a DNR.  At this time is on a monitor and has normal stable vital signs.  We will do a syncope workup with possible discharge.  Workup unremarkable.  Of note his sats kept reading as low while here but had poor pleth, when adjusted, it was always at 100%.  Observed on monitor for multiple hours without arrhythmia or any e/o bradcycardia.  Stable for discharge to facility with further workup per pcp.   ____________________________________________  FINAL  CLINICAL IMPRESSION(S) / ED DIAGNOSES  Final diagnoses:  Altered mental status, unspecified altered mental status type     MEDICATIONS GIVEN DURING THIS VISIT:  Medications  sodium chloride 0.9 % bolus 1,000 mL (0 mLs Intravenous Stopped 01/09/17 1847)     NEW OUTPATIENT MEDICATIONS STARTED DURING THIS VISIT:  This SmartLink is deprecated. Use AVSMEDLIST instead to display the medication list for a patient.  Note:  This document was prepared using Dragon voice recognition software and may include unintentional dictation errors.   Wanya Bangura, Barbara CowerJason, MD 01/10/17 [phone]

## 2017-01-09 NOTE — ED Notes (Addendum)
Per [facility], upon arrival, Pt responds to painful stimuli via sternal rub to illicit response. Pt is lethargic, with dementia and is recovering from a recent infection. Pt was taking antibiotics until 2 days ago. Pt is wheel chair bound on a thickened liquid puree food diet at [facility]. Per [facility] , contact info for Pts son is Posey BoyerMarcus Kaney [phone].

## 2017-01-09 NOTE — ED Notes (Signed)
Pt to xray and CT

## 2017-01-10 ENCOUNTER — Encounter (HOSPITAL_COMMUNITY): Payer: Self-pay | Admitting: *Deleted

## 2017-01-10 DIAGNOSIS — R682 Dry mouth, unspecified: Secondary | ICD-10-CM | POA: Diagnosis not present

## 2017-01-10 DIAGNOSIS — N183 Chronic kidney disease, stage 3 (moderate): Secondary | ICD-10-CM | POA: Diagnosis not present

## 2017-01-10 DIAGNOSIS — I119 Hypertensive heart disease without heart failure: Secondary | ICD-10-CM | POA: Diagnosis not present

## 2017-01-10 DIAGNOSIS — M6281 Muscle weakness (generalized): Secondary | ICD-10-CM | POA: Diagnosis not present

## 2017-01-11 DIAGNOSIS — M6281 Muscle weakness (generalized): Secondary | ICD-10-CM | POA: Diagnosis not present

## 2017-01-14 DIAGNOSIS — R600 Localized edema: Secondary | ICD-10-CM | POA: Diagnosis not present

## 2017-01-14 DIAGNOSIS — M79672 Pain in left foot: Secondary | ICD-10-CM | POA: Diagnosis not present

## 2017-01-14 DIAGNOSIS — M6281 Muscle weakness (generalized): Secondary | ICD-10-CM | POA: Diagnosis not present

## 2017-01-14 DIAGNOSIS — M79671 Pain in right foot: Secondary | ICD-10-CM | POA: Diagnosis not present

## 2017-01-15 DIAGNOSIS — I739 Peripheral vascular disease, unspecified: Secondary | ICD-10-CM | POA: Diagnosis not present

## 2017-01-15 DIAGNOSIS — M6281 Muscle weakness (generalized): Secondary | ICD-10-CM | POA: Diagnosis not present

## 2017-01-16 DIAGNOSIS — Z7409 Other reduced mobility: Secondary | ICD-10-CM | POA: Diagnosis not present

## 2017-01-16 DIAGNOSIS — M79605 Pain in left leg: Secondary | ICD-10-CM | POA: Diagnosis not present

## 2017-01-16 DIAGNOSIS — M6281 Muscle weakness (generalized): Secondary | ICD-10-CM | POA: Diagnosis not present

## 2017-01-17 DIAGNOSIS — M6281 Muscle weakness (generalized): Secondary | ICD-10-CM | POA: Diagnosis not present

## 2017-01-18 DIAGNOSIS — M6281 Muscle weakness (generalized): Secondary | ICD-10-CM | POA: Diagnosis not present

## 2017-01-18 DIAGNOSIS — E782 Mixed hyperlipidemia: Secondary | ICD-10-CM | POA: Diagnosis not present

## 2017-01-18 DIAGNOSIS — I739 Peripheral vascular disease, unspecified: Secondary | ICD-10-CM | POA: Diagnosis not present

## 2017-01-20 ENCOUNTER — Encounter (HOSPITAL_COMMUNITY): Payer: Self-pay

## 2017-01-20 ENCOUNTER — Observation Stay (HOSPITAL_COMMUNITY)
Admission: EM | Admit: 2017-01-20 | Discharge: 2017-01-21 | Disposition: A | Discharge status: Skilled Nursing Facility | Payer: Medicare Other | Attending: Family Medicine | Admitting: Family Medicine

## 2017-01-20 ENCOUNTER — Emergency Department (HOSPITAL_COMMUNITY): Payer: Medicare Other

## 2017-01-20 DIAGNOSIS — L089 Local infection of the skin and subcutaneous tissue, unspecified: Secondary | ICD-10-CM | POA: Diagnosis not present

## 2017-01-20 DIAGNOSIS — I70261 Atherosclerosis of native arteries of extremities with gangrene, right leg: Principal | ICD-10-CM | POA: Insufficient documentation

## 2017-01-20 DIAGNOSIS — F0390 Unspecified dementia without behavioral disturbance: Secondary | ICD-10-CM | POA: Diagnosis not present

## 2017-01-20 DIAGNOSIS — L03031 Cellulitis of right toe: Secondary | ICD-10-CM | POA: Insufficient documentation

## 2017-01-20 DIAGNOSIS — Z955 Presence of coronary angioplasty implant and graft: Secondary | ICD-10-CM | POA: Diagnosis not present

## 2017-01-20 DIAGNOSIS — Z79899 Other long term (current) drug therapy: Secondary | ICD-10-CM | POA: Diagnosis not present

## 2017-01-20 DIAGNOSIS — I6789 Other cerebrovascular disease: Secondary | ICD-10-CM | POA: Diagnosis not present

## 2017-01-20 DIAGNOSIS — I10 Essential (primary) hypertension: Secondary | ICD-10-CM | POA: Diagnosis not present

## 2017-01-20 DIAGNOSIS — Z91010 Allergy to peanuts: Secondary | ICD-10-CM | POA: Insufficient documentation

## 2017-01-20 DIAGNOSIS — M869 Osteomyelitis, unspecified: Secondary | ICD-10-CM | POA: Diagnosis not present

## 2017-01-20 DIAGNOSIS — Z66 Do not resuscitate: Secondary | ICD-10-CM | POA: Diagnosis not present

## 2017-01-20 DIAGNOSIS — Z880 Allergy status to penicillin: Secondary | ICD-10-CM | POA: Diagnosis not present

## 2017-01-20 DIAGNOSIS — M1990 Unspecified osteoarthritis, unspecified site: Secondary | ICD-10-CM | POA: Insufficient documentation

## 2017-01-20 DIAGNOSIS — I2510 Atherosclerotic heart disease of native coronary artery without angina pectoris: Secondary | ICD-10-CM | POA: Insufficient documentation

## 2017-01-20 DIAGNOSIS — L0390 Cellulitis, unspecified: Secondary | ICD-10-CM | POA: Diagnosis present

## 2017-01-20 DIAGNOSIS — Z7982 Long term (current) use of aspirin: Secondary | ICD-10-CM | POA: Diagnosis not present

## 2017-01-20 DIAGNOSIS — N400 Enlarged prostate without lower urinary tract symptoms: Secondary | ICD-10-CM | POA: Diagnosis not present

## 2017-01-20 DIAGNOSIS — E785 Hyperlipidemia, unspecified: Secondary | ICD-10-CM | POA: Diagnosis not present

## 2017-01-20 DIAGNOSIS — R531 Weakness: Secondary | ICD-10-CM | POA: Diagnosis not present

## 2017-01-20 DIAGNOSIS — I739 Peripheral vascular disease, unspecified: Secondary | ICD-10-CM | POA: Diagnosis present

## 2017-01-20 DIAGNOSIS — E7800 Pure hypercholesterolemia, unspecified: Secondary | ICD-10-CM | POA: Diagnosis not present

## 2017-01-20 DIAGNOSIS — R413 Other amnesia: Secondary | ICD-10-CM | POA: Diagnosis present

## 2017-01-20 HISTORY — DX: Cognitive communication deficit: R41.841

## 2017-01-20 LAB — CBC WITH DIFFERENTIAL/PLATELET
BASOS ABS: 0 10*3/uL (ref 0.0–0.1)
Basophils Relative: 0 %
Eosinophils Absolute: 0.2 10*3/uL (ref 0.0–0.7)
Eosinophils Relative: 3 %
HEMATOCRIT: 32.5 % — AB (ref 39.0–52.0)
Hemoglobin: 10.3 g/dL — ABNORMAL LOW (ref 13.0–17.0)
LYMPHS PCT: 22 %
Lymphs Abs: 1.8 10*3/uL (ref 0.7–4.0)
MCH: 29.1 pg (ref 26.0–34.0)
MCHC: 31.7 g/dL (ref 30.0–36.0)
MCV: 91.8 fL (ref 78.0–100.0)
MONO ABS: 0.9 10*3/uL (ref 0.1–1.0)
MONOS PCT: 11 %
NEUTROS ABS: 5.2 10*3/uL (ref 1.7–7.7)
Neutrophils Relative %: 64 %
Platelets: 388 10*3/uL (ref 150–400)
RBC: 3.54 MIL/uL — ABNORMAL LOW (ref 4.22–5.81)
RDW: 14 % (ref 11.5–15.5)
WBC: 8.3 10*3/uL (ref 4.0–10.5)

## 2017-01-20 LAB — MRSA PCR SCREENING: MRSA BY PCR: NEGATIVE

## 2017-01-20 LAB — BASIC METABOLIC PANEL
ANION GAP: 7 (ref 5–15)
BUN: 43 mg/dL — ABNORMAL HIGH (ref 6–20)
CALCIUM: 8.9 mg/dL (ref 8.9–10.3)
CHLORIDE: 112 mmol/L — AB (ref 101–111)
CO2: 21 mmol/L — AB (ref 22–32)
Creatinine, Ser: 1.58 mg/dL — ABNORMAL HIGH (ref 0.61–1.24)
GFR calc Af Amer: 42 mL/min — ABNORMAL LOW (ref >60)
GFR calc non Af Amer: 37 mL/min — ABNORMAL LOW (ref >60)
GLUCOSE: 121 mg/dL — AB (ref 65–99)
Potassium: 4.6 mmol/L (ref 3.5–5.1)
Sodium: 140 mmol/L (ref 135–145)

## 2017-01-20 LAB — SEDIMENTATION RATE: Sed Rate: 45 mm/hr — ABNORMAL HIGH (ref 0–16)

## 2017-01-20 MED ORDER — METRONIDAZOLE IN NACL 5-0.79 MG/ML-% IV SOLN
500.0000 mg | Freq: Three times a day (TID) | INTRAVENOUS | Status: DC
  Administered 2017-01-20 – 2017-01-21 (×3): 500 mg via INTRAVENOUS
  Filled 2017-01-20 (×4): qty 100

## 2017-01-20 MED ORDER — ASPIRIN 81 MG PO CHEW
81.0000 mg | CHEWABLE_TABLET | Freq: Every day | ORAL | Status: DC
  Administered 2017-01-21: 81 mg via ORAL
  Filled 2017-01-20 (×2): qty 1

## 2017-01-20 MED ORDER — ONDANSETRON HCL 4 MG/2ML IJ SOLN: 4.0000 mg | Freq: Four times a day (QID) | INTRAMUSCULAR | Status: DC | PRN

## 2017-01-20 MED ORDER — DOCUSATE SODIUM 100 MG PO CAPS
100.0000 mg | ORAL_CAPSULE | Freq: Two times a day (BID) | ORAL | Status: DC
  Administered 2017-01-20 – 2017-01-21 (×2): 100 mg via ORAL
  Filled 2017-01-20 (×2): qty 1

## 2017-01-20 MED ORDER — ACETAMINOPHEN 650 MG RE SUPP: 650.0000 mg | Freq: Four times a day (QID) | RECTAL | Status: DC | PRN

## 2017-01-20 MED ORDER — ONDANSETRON HCL 4 MG PO TABS: 4.0000 mg | ORAL_TABLET | Freq: Four times a day (QID) | ORAL | Status: DC | PRN

## 2017-01-20 MED ORDER — MELATONIN 3 MG PO TABS
3.0000 mg | ORAL_TABLET | Freq: Every day | ORAL | Status: DC
  Filled 2017-01-20 (×3): qty 1

## 2017-01-20 MED ORDER — DEXTROSE 5 % IV SOLN
2.0000 g | INTRAVENOUS | Status: DC
  Administered 2017-01-20: 2 g via INTRAVENOUS
  Filled 2017-01-20 (×3): qty 2

## 2017-01-20 MED ORDER — MIRTAZAPINE 15 MG PO TABS
7.5000 mg | ORAL_TABLET | Freq: Every day | ORAL | Status: DC
  Administered 2017-01-20: 7.5 mg via ORAL
  Filled 2017-01-20: qty 1

## 2017-01-20 MED ORDER — ENOXAPARIN SODIUM 30 MG/0.3ML ~~LOC~~ SOLN
30.0000 mg | SUBCUTANEOUS | Status: DC
  Administered 2017-01-20: 30 mg via SUBCUTANEOUS
  Filled 2017-01-20: qty 0.3

## 2017-01-20 MED ORDER — ACETAMINOPHEN 325 MG PO TABS: 650.0000 mg | ORAL_TABLET | Freq: Four times a day (QID) | ORAL | Status: DC | PRN

## 2017-01-20 MED ORDER — HYDRALAZINE HCL 20 MG/ML IJ SOLN: 5.0000 mg | INTRAMUSCULAR | Status: DC | PRN

## 2017-01-20 NOTE — ED Triage Notes (Signed)
[facility] and they sent him to ER today for necrotic R middle toe.  Reports toenail came off on Nov 7 and pt had doppler study Thursday but they do not know results.  Staff told ems pt did not have a pedal pulse.  Foot cool to touch.

## 2017-01-20 NOTE — Progress Notes (Signed)
Pharmacy Antibiotic Note  Frank Fleming is a 83 y.o. male admitted on 01/20/2017 with diabetic foot infection.  Pharmacy has been consulted for Vancomycin dosing.  Plan: Vancomycin 1000mg  loading dose, then 500mg  IV every 24 hours.  Goal trough 15-20 mcg/mL.  Also on Ceftriaxone 2gm IV q24h F/U cxs and clinical progress Monitor V/S, labs, and levels as indicated  Height: 5' 6.93" (170 cm) Weight: 117 lb 11.6 oz (53.4 kg) IBW/kg (Calculated) : 65.94  Temp (24hrs), Avg:98.9 F (37.2 C), Min:98.7 F (37.1 C), Max:99.1 F (37.3 C)  Recent Labs  Lab 01/20/17 1544  WBC 8.3  CREATININE 1.58*    Estimated Creatinine Clearance: 23 mL/min (A) (by C-G formula based on SCr of 1.58 mg/dL (H)).    Allergies  Allergen Reactions  . Peanut-Containing Drug Products     UNKNOWN-LISTED PER NURSING FACILITY  . Penicillins     Unknown-to patient and family   . Penicillins Other (See Comments)    Has patient had a PCN reaction causing immediate rash, facial/tongue/throat swelling, SOB or lightheadedness with hypotension: Unknown Has patient had a PCN reaction causing severe rash involving mucus membranes or skin necrosis: Unknown Has patient had a PCN reaction that required hospitalization: Unknown Has patient had a PCN reaction occurring within the last 10 years: Unknown If all of the above answers are "NO", then may proceed with Cephalosporin use.   . Zocor [Simvastatin]     unknown  . Zocor [Simvastatin]     UNKNOWN-LISTED PER NURSING FACILITY    Antimicrobials this admission: Vancomycin 11/18 >>  Ceftriaxone 11/18 >>   Dose adjustments this admission: N/A  Microbiology results: 11/18 BCx: pending 11/18 MRSA PCR: pending  Thank you for allowing pharmacy to be a part of this patient's care.  Elder CyphersLorie Raef Sprigg, BS Loura Backharm D, [place] Clinical Pharmacist Pager [phone] 01/20/2017 8:04 PM

## 2017-01-20 NOTE — ED Notes (Signed)
Unable to find pulse in left foot. EDP made aware. Cap refill < 2 seconds.

## 2017-01-20 NOTE — ED Notes (Signed)
Patients son Frank Fleming would like to keep updated on patients care. Phone number [phone].

## 2017-01-20 NOTE — ED Provider Notes (Signed)
[facility] EMERGENCY DEPARTMENT Provider Note   CSN: [id number] Arrival date & time: 01/20/17  1510     History   Chief Complaint Chief Complaint  Patient presents with  . Wound Check    HPI Frank Fleming is a 83 y.o. male.  HPI Level 5 caveat due to dementia. Patient reportedly sent in for right foot infection and necrotic toe.  Reportedly toenail fell off 11 days ago.  Reportedly has had Doppler of the foot although do not know if it is arterial or venous.  Reportedly has no results yet for this.  Patient cannot really provide any history. Past Medical History:  Diagnosis Date  . Arthritis   . Atherosclerosis   . Benign prostatic hyperplasia   . CAD (coronary artery disease)    Stent PCI RCA occlusion 2003.  Cypher  . Cognitive communication deficit   . Colon polyp   . Constipation   . Coronary artery disease   . Dementia   . Depression   . Diverticulosis of colon   . Dizziness and giddiness 10/05/2013  . Dysphagia   . Hypercholesterolemia   . Hyperlipidemia   . Hypertension   . Insomnia   . Insomnia   . Knee pain   . Memory impairment   . Muscle weakness (generalized)   . Peripheral vascular disease (HCC)   . Pneumonia     Patient Active Problem List   Diagnosis Date Noted  . Dizziness and giddiness 10/05/2013  . Memory impairment 05/28/2013  . HYPERCHOLESTEROLEMIA 11/14/2009  . Essential hypertension 11/14/2009  . CORONARY ATHEROSCLEROSIS NATIVE CORONARY ARTERY 11/14/2009  . PVD 11/14/2009    Past Surgical History:  Procedure Laterality Date  . ANGIOPLASTY    . APPENDECTOMY    . ENDARTERECTOMY FEMORAL    . HERNIA REPAIR     right inguinal  . TONSILLECTOMY         Home Medications    Prior to Admission medications   Medication Sig Start Date End Date Taking? Authorizing Provider  aspirin 81 MG tablet Take 81 mg by mouth daily.    [provider]  aspirin EC 81 MG tablet Take 81 mg daily by mouth.    [provider]    cephALEXin (KEFLEX) 500 MG capsule Take 1 capsule (500 mg total) by mouth 3 (three) times daily. 04/04/16   Ernestina PennaMoore, Donald W, MD  Cholecalciferol (VITAMIN D PO) Take 1 capsule by mouth daily.    [provider]  docusate sodium (COLACE) 100 MG capsule Take 100 mg by mouth daily.    [provider]  docusate sodium (COLACE) 100 MG capsule Take 100 mg every morning by mouth.    [provider]  lisinopril (PRINIVIL,ZESTRIL) 2.5 MG tablet TAKE 1 TABLET (2.5 MG TOTAL) TWICE A DAY 02/07/15   Ernestina PennaMoore, Donald W, MD  lisinopril (PRINIVIL,ZESTRIL) 2.5 MG tablet TAKE 1 TABLET (2.5 MG TOTAL) BY MOUTH DAILY. 04/06/16   Ernestina PennaMoore, Donald W, MD  lisinopril (PRINIVIL,ZESTRIL) 2.5 MG tablet Take 2.5 mg daily by mouth.    [provider]  Melatonin 3 MG TABS Take 3 mg at bedtime by mouth.     [provider]  mirtazapine (REMERON) 7.5 MG tablet Take 7.5 mg at bedtime by mouth.    [provider]    Family History Family History  Problem Relation Age of Onset  . Leukemia Sister     Social History Social History   Tobacco Use  . Smoking status: Never Smoker  .  Smokeless tobacco: Never Used  . Tobacco comment: Quit tobacco 40 years ago.   Substance Use Topics  . Alcohol use: No    Frequency: Never  . Drug use: No     Allergies   Peanut-containing drug products; Penicillins; Penicillins; Zocor [simvastatin]; and Zocor [simvastatin]   Review of Systems Review of Systems  Unable to perform ROS: Dementia     Physical Exam Updated Vital Signs BP (!) 148/68 (BP Location: Left Arm)   Pulse 88   Temp 98.7 F (37.1 C) (Rectal)   Resp 20   Wt 59.9 kg (132 lb)   SpO2 99%   BMI 20.67 kg/m   Physical Exam  Constitutional: He appears well-developed.  HENT:  Head: Atraumatic.  Cardiovascular: Normal rate.  Pulmonary/Chest: Effort normal.  Abdominal: There is no tenderness.  Musculoskeletal:  Necrotic right second toe.  No drainage.  There is some  erythema over most of the toes.  Does have decent capillary refill but no palpable dorsalis pedis or posterior tibial pulse.  Neurological:  Patient is demented.  States he just wants to die and get taken away.  Appears to be moving all extremities.  Skin: Skin is warm. Capillary refill takes less than 2 seconds.     ED Treatments / Results  Labs (all labs ordered are listed, but only abnormal results are displayed) Labs Reviewed  CBC WITH DIFFERENTIAL/PLATELET - Abnormal; Notable for the following components:      Result Value   RBC 3.54 (*)    Hemoglobin 10.3 (*)    HCT 32.5 (*)    All other components within normal limits  BASIC METABOLIC PANEL - Abnormal; Notable for the following components:   Chloride 112 (*)    CO2 21 (*)    Glucose, Bld 121 (*)    BUN 43 (*)    Creatinine, Ser 1.58 (*)    GFR calc non Af Amer 37 (*)    GFR calc Af Amer 42 (*)    All other components within normal limits    EKG  EKG Interpretation None       Radiology Dg Toe 2nd Right  Result Date: 01/20/2017 CLINICAL DATA:  RIGHT SECOND TOE BLACK NECROSIS EXAM: RIGHT SECOND TOE COMPARISON:  None. FINDINGS: Subtle irregularity of the cortex of the tuft of the second distal phalanx. Remainder of the visualized osseous structures appear intact and normal in mineralization. Overlying soft tissues are slightly irregular, compatible with the given history of necrosis. No soft tissue gas seen. IMPRESSION: 1. Subtle irregularity of the cortex at the tuft of the second distal phalanx, with indistinct margins suspicious for early destructive changes of osteomyelitis. 2. Subtle irregularity of the overlying soft tissues compatible with the given history of necrosis. No soft tissue gas seen. Electronically Signed   By: Bary RichardStan  Maynard M.D.   On: 01/20/2017 16:01    Procedures Procedures (including critical care time)  Medications Ordered in ED Medications - No data to display   Initial Impression /  Assessment and Plan / ED Course  I have reviewed the triage vital signs and the nursing notes.  Pertinent labs & imaging results that were available during my care of the patient were reviewed by me and considered in my medical decision making (see chart for details).     Patient with right second toe infection.  Has apparent osteomyelitis but also necrosis.  This appears it is likely acute on chronic.  Reportedly had some sort of Doppler done but unable  to get the report for it.  He is too demented to provide much history.  Has good capillary refill but cannot palpate pulse in the foot.  Will admit to hospitalist for further management.  Final Clinical Impressions(s) / ED Diagnoses   Final diagnoses:  Toe osteomyelitis, right [facility])    ED Discharge Orders    None       Benjiman Core, MD 01/20/17 1642

## 2017-01-20 NOTE — H&P (Signed)
History and Physical    Frank Fleming:[id number] DOB: Nov 29, 1923 DOA: 01/20/2017  PCP: Bernerd Limbo, MD - [facility]  Chief Complaint:  Necrotic toe  HPI: Frank Fleming is a 83 y.o. male with medical history significant of advanced dementia; PVD; HTN; HLD; and CAD presenting with a necrotic toe.  The patient is unaccompanied and unable to provide history due to his dementia.  History as per ER physician:   Level 5 caveat due to dementia.  Patient reportedly sent in for right foot infection and necrotic toe. Reportedly toenail fell off 11 days ago. Reportedly has had Doppler of the foot although do not know if it is arterial or venous. Reportedly has no results yet for this. Patient cannot really provide any history.   ED Course: Necrotic right second toe with infection.  Likely acute on chronic.  Doppler pending.  Good capillary refill but minimal pulses.  Review of Systems: Unable to perform  PMH, SH, FH, and PSH reviewed in Epic but unable to verify with patient.   Past Medical History:  Diagnosis Date  . Arthritis   . Benign prostatic hyperplasia   . CAD (coronary artery disease)    Stent PCI RCA occlusion 2003.  Cypher  . Cognitive communication deficit   . Colon polyp   . Constipation   . Dementia   . Depression   . Diverticulosis of colon   . Dizziness and giddiness 10/05/2013  . Dysphagia   . Hypercholesterolemia   . Hyperlipidemia   . Hypertension   . Insomnia   . Knee pain   . Muscle weakness (generalized)   . Peripheral vascular disease (HCC)   . Pneumonia     Past Surgical History:  Procedure Laterality Date  . ANGIOPLASTY    . APPENDECTOMY    . ENDARTERECTOMY FEMORAL    . HERNIA REPAIR     right inguinal  . TONSILLECTOMY      Social History   Socioeconomic History  . Marital status: Widowed    Spouse name: Not on file  . Number of children: 2  . Years of education: Hotel manager  . Highest education level: Not on file    Social Needs  . Financial resource strain: Not on file  . Food insecurity - worry: Not on file  . Food insecurity - inability: Not on file  . Transportation needs - medical: Not on file  . Transportation needs - non-medical: Not on file  Occupational History  . Occupation: Retired  Tobacco Use  . Smoking status: Never Smoker  . Smokeless tobacco: Never Used  . Tobacco comment: Quit tobacco 40 years ago.   Substance and Sexual Activity  . Alcohol use: No    Frequency: Never  . Drug use: No  . Sexual activity: No  Other Topics Concern  . Not on file  Social History Narrative   ** Merged History Encounter **       Lives alone.      Allergies  Allergen Reactions  . Peanut-Containing Drug Products     UNKNOWN-LISTED PER NURSING FACILITY  . Penicillins     Unknown-to patient and family   . Penicillins Other (See Comments)    Has patient had a PCN reaction causing immediate rash, facial/tongue/throat swelling, SOB or lightheadedness with hypotension: Unknown Has patient had a PCN reaction causing severe rash involving mucus membranes or skin necrosis: Unknown Has patient had a PCN reaction that required hospitalization: Unknown Has patient had a PCN  reaction occurring within the last 10 years: Unknown If all of the above answers are "NO", then may proceed with Cephalosporin use.   . Zocor [Simvastatin]     unknown  . Zocor [Simvastatin]     UNKNOWN-LISTED PER NURSING FACILITY    Family History  Problem Relation Age of Onset  . Leukemia Sister     Prior to Admission medications   Medication Sig Start Date End Date Taking? Authorizing Provider  aspirin 81 MG tablet Take 81 mg by mouth daily.    [provider]  aspirin EC 81 MG tablet Take 81 mg daily by mouth.    [provider]  cephALEXin (KEFLEX) 500 MG capsule Take 1 capsule (500 mg total) by mouth 3 (three) times daily. 04/04/16   Ernestina PennaMoore, Donald W, MD  Cholecalciferol (VITAMIN D PO) Take 1 capsule  by mouth daily.    [provider]  docusate sodium (COLACE) 100 MG capsule Take 100 mg by mouth daily.    [provider]  docusate sodium (COLACE) 100 MG capsule Take 100 mg every morning by mouth.    [provider]  lisinopril (PRINIVIL,ZESTRIL) 2.5 MG tablet TAKE 1 TABLET (2.5 MG TOTAL) TWICE A DAY 02/07/15   Ernestina PennaMoore, Donald W, MD  lisinopril (PRINIVIL,ZESTRIL) 2.5 MG tablet TAKE 1 TABLET (2.5 MG TOTAL) BY MOUTH DAILY. 04/06/16   Ernestina PennaMoore, Donald W, MD  lisinopril (PRINIVIL,ZESTRIL) 2.5 MG tablet Take 2.5 mg daily by mouth.    [provider]  Melatonin 3 MG TABS Take 3 mg at bedtime by mouth.     [provider]  mirtazapine (REMERON) 7.5 MG tablet Take 7.5 mg at bedtime by mouth.    [provider]    Physical Exam: Vitals:   01/20/17 1514 01/20/17 1517 01/20/17 1824 01/20/17 2000  BP: (!) 148/68  (!) 175/74   Pulse: 88  89   Resp: 20  18   Temp: 98.7 F (37.1 C)  99.1 F (37.3 C)   TempSrc: Rectal  Oral   SpO2: 99%  100%   Weight:  59.9 kg (132 lb) 53.4 kg (117 lb 11.6 oz)   Height:    5' 6.93" (1.7 m)     General: He is agitated, talking repetitively, and unable to have a meaningful conversation or answer questions appropriately Eyes:  PERRL, EOMI, normal lids, iris ENT:  normal lips & tongue, mmm Neck:  no LAD, masses or thyromegaly Cardiovascular:  RRR, no m/r/g. No LE edema.  Respiratory:   CTA bilaterally with no wheezes/rales/rhonchi.  Normal respiratory effort. Abdomen:  soft, NT, ND, NABS Skin:  no rash or induration seen on limited exam other than as described below Musculoskeletal:  grossly normal tone BUE/BLE, good ROM, no bony abnormality Lower extremity: Right 2nd toe is necrotic with erythema extending up towards the knee, minimally appreciable distal pulses on the right although capillary refill is appropriate Psychiatric: advanced dementia, unable to answer questions effectively, perseverates with  speech Neurologic: unable to assess    Radiological Exams on Admission: Dg Toe 2nd Right  Result Date: 01/20/2017 CLINICAL DATA:  RIGHT SECOND TOE BLACK NECROSIS EXAM: RIGHT SECOND TOE COMPARISON:  None. FINDINGS: Subtle irregularity of the cortex of the tuft of the second distal phalanx. Remainder of the visualized osseous structures appear intact and normal in mineralization. Overlying soft tissues are slightly irregular, compatible with the given history of necrosis. No soft tissue gas seen. IMPRESSION: 1. Subtle irregularity of the cortex at the tuft of  the second distal phalanx, with indistinct margins suspicious for early destructive changes of osteomyelitis. 2. Subtle irregularity of the overlying soft tissues compatible with the given history of necrosis. No soft tissue gas seen. Electronically Signed   By: Bary RichardStan  Maynard M.D.   On: 01/20/2017 16:01    EKG: not done   Labs on Admission: I have personally reviewed the available labs and imaging studies at the time of the admission.  Pertinent labs:   Glucose 121 BUN 43/Creatinine 1.58/GFR 37 Hgb 10.3 WBC 8.3  Assessment/Plan Principal Problem:   Right foot infection Active Problems:   Essential hypertension   PVD   Memory impairment   Necrotic right 2nd toe with infection and PVD -This process appears to have been ongoing for some time although there does appear to be an acute infection component present at this time -The patient reportedly had Dopplers done last week although that information is not currently available -Will admit, Med Surg -General surgery consult tomorrow - it is not clear that he is a candidate for amputation and he is likely not a candidate for revascularization but will defer to surgery -Antibiotics with Rocephin, Flagyl, and Vancomycin per lower extremity wound order set -Lower extremity wound order set utilized, including orders for CM, SW, wound care, and nutrition consult. -Patient should be on  bed rest, non-weight bearing. -Excellent BP control is needed   Dementia -Patient has advanced dementia and is unable to participate in his care plan -Will need to contact his son to discuss the plan when appropriate -Continue Remeron, Melatonin  HTN -Hold Lisinopril for now based on elevated creatinine -Will use prn IV hydralazine   DVT prophylaxis: SCDs Code Status: DNR  - gold paper present from facility Family Communication: None present Disposition Plan:  Back to SNF once clinically improved Consults called: Surgery; CM/SW/wound care/nutrition Admission status: Admit - It is my clinical opinion that admission to INPATIENT is reasonable and necessary because this patient will require at least 2 midnights in the hospital to treat this condition based on the medical complexity of the problems presented.  Given the aforementioned information, the predictability of an adverse outcome is felt to be significant.    Jonah BlueJennifer Arius Harnois MD [facility]  If note is complete, please contact covering daytime or nighttime physician. [email or web address] Password [email or web address]  01/20/2017, 8:28 PM

## 2017-01-21 DIAGNOSIS — I10 Essential (primary) hypertension: Secondary | ICD-10-CM | POA: Diagnosis not present

## 2017-01-21 DIAGNOSIS — Z7401 Bed confinement status: Secondary | ICD-10-CM | POA: Diagnosis not present

## 2017-01-21 DIAGNOSIS — I739 Peripheral vascular disease, unspecified: Secondary | ICD-10-CM | POA: Diagnosis not present

## 2017-01-21 DIAGNOSIS — L089 Local infection of the skin and subcutaneous tissue, unspecified: Secondary | ICD-10-CM

## 2017-01-21 DIAGNOSIS — I96 Gangrene, not elsewhere classified: Secondary | ICD-10-CM

## 2017-01-21 DIAGNOSIS — R413 Other amnesia: Secondary | ICD-10-CM | POA: Diagnosis not present

## 2017-01-21 DIAGNOSIS — R279 Unspecified lack of coordination: Secondary | ICD-10-CM | POA: Diagnosis not present

## 2017-01-21 DIAGNOSIS — L0390 Cellulitis, unspecified: Secondary | ICD-10-CM | POA: Diagnosis present

## 2017-01-21 LAB — BLOOD CULTURE ID PANEL (REFLEXED)
ACINETOBACTER BAUMANNII: NOT DETECTED
CANDIDA KRUSEI: NOT DETECTED
CANDIDA PARAPSILOSIS: NOT DETECTED
CANDIDA TROPICALIS: NOT DETECTED
Candida albicans: NOT DETECTED
Candida glabrata: NOT DETECTED
ESCHERICHIA COLI: NOT DETECTED
Enterobacter cloacae complex: NOT DETECTED
Enterobacteriaceae species: NOT DETECTED
Enterococcus species: NOT DETECTED
HAEMOPHILUS INFLUENZAE: NOT DETECTED
KLEBSIELLA OXYTOCA: NOT DETECTED
KLEBSIELLA PNEUMONIAE: NOT DETECTED
Listeria monocytogenes: NOT DETECTED
Methicillin resistance: DETECTED — AB
Neisseria meningitidis: NOT DETECTED
PSEUDOMONAS AERUGINOSA: NOT DETECTED
Proteus species: NOT DETECTED
SERRATIA MARCESCENS: NOT DETECTED
STAPHYLOCOCCUS AUREUS BCID: NOT DETECTED
STREPTOCOCCUS PNEUMONIAE: NOT DETECTED
STREPTOCOCCUS PYOGENES: NOT DETECTED
Staphylococcus species: DETECTED — AB
Streptococcus agalactiae: NOT DETECTED
Streptococcus species: NOT DETECTED

## 2017-01-21 LAB — BASIC METABOLIC PANEL
ANION GAP: 10 (ref 5–15)
BUN: 36 mg/dL — AB (ref 6–20)
CHLORIDE: 110 mmol/L (ref 101–111)
CO2: 19 mmol/L — AB (ref 22–32)
Calcium: 8.7 mg/dL — ABNORMAL LOW (ref 8.9–10.3)
Creatinine, Ser: 1.4 mg/dL — ABNORMAL HIGH (ref 0.61–1.24)
GFR calc Af Amer: 49 mL/min — ABNORMAL LOW (ref >60)
GFR, EST NON AFRICAN AMERICAN: 42 mL/min — AB (ref >60)
GLUCOSE: 105 mg/dL — AB (ref 65–99)
POTASSIUM: 4.2 mmol/L (ref 3.5–5.1)
Sodium: 139 mmol/L (ref 135–145)

## 2017-01-21 LAB — CBC
HEMATOCRIT: 33.9 % — AB (ref 39.0–52.0)
HEMOGLOBIN: 10.7 g/dL — AB (ref 13.0–17.0)
MCH: 28.6 pg (ref 26.0–34.0)
MCHC: 31.6 g/dL (ref 30.0–36.0)
MCV: 90.6 fL (ref 78.0–100.0)
Platelets: 412 10*3/uL — ABNORMAL HIGH (ref 150–400)
RBC: 3.74 MIL/uL — ABNORMAL LOW (ref 4.22–5.81)
RDW: 13.8 % (ref 11.5–15.5)
WBC: 11 10*3/uL — ABNORMAL HIGH (ref 4.0–10.5)

## 2017-01-21 LAB — C-REACTIVE PROTEIN: CRP: <0.8 mg/dL (ref <1.0)

## 2017-01-21 LAB — PREALBUMIN: Prealbumin: 17.6 mg/dL — ABNORMAL LOW (ref 18–38)

## 2017-01-21 MED ORDER — VANCOMYCIN HCL IN DEXTROSE 1-5 GM/200ML-% IV SOLN
1000.0000 mg | Freq: Once | INTRAVENOUS | Status: AC
  Administered 2017-01-21: 1000 mg via INTRAVENOUS
  Filled 2017-01-21: qty 200

## 2017-01-21 MED ORDER — DOXYCYCLINE HYCLATE 100 MG PO CAPS: 100.0000 mg | ORAL_CAPSULE | Freq: Two times a day (BID) | ORAL | 0 refills | Status: AC

## 2017-01-21 MED ORDER — SODIUM CHLORIDE 0.9 % IV SOLN
500.0000 mg | INTRAVENOUS | Status: DC
  Filled 2017-01-21: qty 500

## 2017-01-21 MED ORDER — ACETAMINOPHEN 325 MG PO TABS: 650.0000 mg | ORAL_TABLET | Freq: Four times a day (QID) | ORAL | Status: AC | PRN

## 2017-01-21 NOTE — NC FL2 (Signed)
[place] MEDICAID FL2 LEVEL OF CARE SCREENING TOOL     IDENTIFICATION  Patient Name: Frank BlamerCharles M Justus Birthdate: 03/25/1923 Sex: male Admission Date (Current Location): 01/20/2017  [facility] [place]   Facility and Address:  [facility],  [address], [place] [id number]      Provider Number: [id number]  Attending Physician Name and Address:  Cleora FleetJohnson, Clanford L, MD  Relative Name and Phone Number:       Current Level of Care: Other (Comment)(observation) Recommended Level of Care: Skilled Nursing Facility Prior Approval Number:    Date Approved/Denied:   PASRR Number:    Discharge Plan: SNF    Current Diagnoses: Patient Active Problem List   Diagnosis Date Noted  . Cellulitis 01/21/2017  . Right foot infection 01/20/2017  . Dizziness and giddiness 10/05/2013  . Memory impairment 05/28/2013  . HYPERCHOLESTEROLEMIA 11/14/2009  . Essential hypertension 11/14/2009  . CORONARY ATHEROSCLEROSIS NATIVE CORONARY ARTERY 11/14/2009  . PVD 11/14/2009    Orientation RESPIRATION BLADDER Height & Weight     Self  Normal Incontinent Weight: 117 lb 11.6 oz (53.4 kg) Height:  5' 6.93" (170 cm)  BEHAVIORAL SYMPTOMS/MOOD NEUROLOGICAL BOWEL NUTRITION STATUS      Incontinent Diet(Low sodium/Heart Healthy)  AMBULATORY STATUS COMMUNICATION OF NEEDS Skin   Total Care(uses wheelchair )   Other (Comment)(Non pressure wound: toe, right)                       Personal Care Assistance Level of Assistance  Bathing, Feeding, Dressing Bathing Assistance: Limited assistance Feeding assistance: Independent Dressing Assistance: Limited assistance     Functional Limitations Info  Sight, Hearing, Speech Sight Info: Adequate Hearing Info: Adequate Speech Info: Adequate    SPECIAL CARE FACTORS FREQUENCY                       Contractures Contractures Info: Not present    Additional Factors Info  Code Status, Allergies, Psychotropic  Code Status Info: DNR Allergies Info: Peanut containing drug products; Penicillins, zocar Psychotropic Info: Remeron         Current Medications (01/21/2017):  This is the current hospital active medication list Current Facility-Administered Medications  Medication Dose Route Frequency Provider Last Rate Last Dose  . acetaminophen (TYLENOL) tablet 650 mg  650 mg Oral Q6H PRN Jonah BlueYates, Jennifer, MD       Or  . acetaminophen (TYLENOL) suppository 650 mg  650 mg Rectal Q6H PRN Jonah BlueYates, Jennifer, MD      . aspirin chewable tablet 81 mg  81 mg Oral Daily Jonah BlueYates, Jennifer, MD   81 mg at 01/21/17 1120  . cefTRIAXone (ROCEPHIN) 2 g in dextrose 5 % 50 mL IVPB  2 g Intravenous Q24H Jonah BlueYates, Jennifer, MD   Stopped at 01/20/17 2245  . docusate sodium (COLACE) capsule 100 mg  100 mg Oral BID Jonah BlueYates, Jennifer, MD   100 mg at 01/21/17 1120  . hydrALAZINE (APRESOLINE) injection 5 mg  5 mg Intravenous Q4H PRN Jonah BlueYates, Jennifer, MD      . Melatonin TABS 3 mg  3 mg Oral QHS Jonah BlueYates, Jennifer, MD      . metroNIDAZOLE (FLAGYL) IVPB 500 mg  500 mg Intravenous Bobette MoQ8H Yates, Jennifer, MD   Stopped at 01/21/17 1439  . mirtazapine (REMERON) tablet 7.5 mg  7.5 mg Oral QHS Jonah BlueYates, Jennifer, MD   7.5 mg at 01/20/17 2216  . ondansetron (ZOFRAN) tablet 4 mg  4 mg  Oral Q6H PRN Jonah BlueYates, Jennifer, MD       Or  . ondansetron [facility](ZOFRAN) injection 4 mg  4 mg Intravenous Q6H PRN Jonah BlueYates, Jennifer, MD      . vancomycin (VANCOCIN) IVPB 1000 mg/200 mL premix  1,000 mg Intravenous Once Johnson, Clanford L, MD 200 mL/hr at 01/21/17 1439 1,000 mg at 01/21/17 1439   Followed by  . [START ON 01/22/2017] vancomycin (VANCOCIN) 500 mg in sodium chloride 0.9 % 100 mL IVPB  500 mg Intravenous Q24H Johnson, Clanford L, MD         Discharge Medications: Please see discharge summary for a list of discharge medications.  Relevant Imaging Results:  Relevant Lab Results:   Additional Information    Katrina Daddona, Juleen ChinaHeather D, LCSW

## 2017-01-21 NOTE — Discharge Summary (Addendum)
Physician Discharge Summary  Frank BlamerCharles M Brathwaite ZOX:[id number] DOB: 01/13/1924 DOA: 01/20/2017  PCP: Bernerd LimboAriza, Fernando Enrique, MD  Admit date: 01/20/2017 Discharge date: 01/21/2017  Admitted From: SNF Disposition: SNF  Recommendations for Outpatient Follow-up:  1. Follow up with PCP in 2 weeks 2. Follow up with surgery in 2 weeks 3. Please obtain BMP/CBC in 1 week 4. Please follow final culture results  Discharge Condition: STABLE   CODE STATUS: DNR   Brief Hospitalization Summary: Please see all hospital notes, images, labs for full details of the hospitalization. HPI: Frank Fleming is a 83 y.o. male with medical history significant of advanced dementia; PVD; HTN; HLD; and CAD presenting with a necrotic toe.  The patient is unaccompanied and unable to provide history due to his dementia.  History as per ER physician:   Level 5 caveat due to dementia.  Patient reportedly sent in for right foot infection and necrotic toe. Reportedly toenail fell off 11 days ago. Reportedly has had Doppler of the foot although do not know if it is arterial or venous. Reportedly has no results yet for this. Patient cannot really provide any history.  ED Course: Necrotic right second toe with infection.  Likely acute on chronic.  Doppler pending.  Good capillary refill but minimal pulses.  The patient was admitted and started on IV antibiotics and a general surgery consult was obtained.  Dr. Lovell SheehanJenkins came and saw the patient and noted that there is minimal cellulitis and he has dry gangrene of the toe and a poor candidate for revascularization or surgery.  He recommended 2 weeks of oral antibiotics.  See recommendations below:  Impression: Dry gangrene of right second toe with minimal cellulitis.  Probable peripheral vascular disease. Plan: No need for acute surgical intervention at this time as there appears to be no active purulent drainage present.  He is not a revascularization candidate even if workup  was performed.  I would just treat him with a 2-week course of oral antibiotics.   Discharge Diagnoses:  Principal Problem:   Right foot infection Active Problems:   Essential hypertension   PVD   Memory impairment  Discharge Instructions: Discharge Instructions    Call MD for:  difficulty breathing, headache or visual disturbances   Complete by:  As directed    Call MD for:  extreme fatigue   Complete by:  As directed    Call MD for:  persistant dizziness or light-headedness   Complete by:  As directed    Call MD for:  persistant nausea and vomiting   Complete by:  As directed    Call MD for:  severe uncontrolled pain   Complete by:  As directed    Diet - low sodium heart healthy   Complete by:  As directed    Increase activity slowly   Complete by:  As directed      Allergies as of 01/21/2017      Reactions   Peanut-containing Drug Products    UNKNOWN-LISTED PER NURSING FACILITY   Penicillins    Unknown-to patient and family    Penicillins Other (See Comments)   Has patient had a PCN reaction causing immediate rash, facial/tongue/throat swelling, SOB or lightheadedness with hypotension: Unknown Has patient had a PCN reaction causing severe rash involving mucus membranes or skin necrosis: Unknown Has patient had a PCN reaction that required hospitalization: Unknown Has patient had a PCN reaction occurring within the last 10 years: Unknown If all of the above answers are "NO",  then may proceed with Cephalosporin use.   Zocor [simvastatin]    unknown   Zocor [simvastatin]    UNKNOWN-LISTED PER NURSING FACILITY      Medication List    STOP taking these medications   clindamycin 300 MG capsule Commonly known as:  CLEOCIN   ibuprofen 200 MG tablet Commonly known as:  ADVIL,MOTRIN   lisinopril 2.5 MG tablet Commonly known as:  PRINIVIL,ZESTRIL   TUBERSOL 5 UNIT/0.1ML injection Generic drug:  tuberculin     TAKE these medications   acetaminophen 325 MG  tablet Commonly known as:  TYLENOL Take 2 tablets (650 mg total) every 6 (six) hours as needed by mouth for mild pain, moderate pain, fever or headache (or Fever >/= 101).   aspirin 81 MG tablet Take 81 mg by mouth daily.   docusate sodium 100 MG capsule Commonly known as:  COLACE Take 100 mg every morning by mouth.   doxycycline 100 MG capsule Commonly known as:  VIBRAMYCIN Take 1 capsule (100 mg total) 2 (two) times daily for 14 days by mouth.   Melatonin 3 MG Tabs Take 3 mg at bedtime by mouth.   mirtazapine 7.5 MG tablet Commonly known as:  REMERON Take 7.5 mg at bedtime by mouth.   saccharomyces boulardii 250 MG capsule Commonly known as:  FLORASTOR Take 250 mg 2 (two) times daily by mouth.   SKIN PREP WIPES Misc Apply 1 application daily topically. Apply to second right toe every day for loss of toenail      Follow-up Information    Bernerd LimboAriza, Fernando Enrique, MD. Schedule an appointment as soon as possible for a visit in 2 week(s).   Specialty:  Internal Medicine Contact information: [address] [place] [address] [phone]        Franky MachoJenkins, Mark, MD. Schedule an appointment as soon as possible for a visit in 2 week(s).   Specialty:  General Surgery Contact information: [address] [place] [phone]          Allergies  Allergen Reactions  . Peanut-Containing Drug Products     UNKNOWN-LISTED PER NURSING FACILITY  . Penicillins     Unknown-to patient and family   . Penicillins Other (See Comments)    Has patient had a PCN reaction causing immediate rash, facial/tongue/throat swelling, SOB or lightheadedness with hypotension: Unknown Has patient had a PCN reaction causing severe rash involving mucus membranes or skin necrosis: Unknown Has patient had a PCN reaction that required hospitalization: Unknown Has patient had a PCN reaction occurring within the last 10 years: Unknown If all of the above answers are "NO",  then may proceed with Cephalosporin use.   . Zocor [Simvastatin]     unknown  . Zocor [Simvastatin]     UNKNOWN-LISTED PER NURSING FACILITY   Current Discharge Medication List    START taking these medications   Details  acetaminophen (TYLENOL) 325 MG tablet Take 2 tablets (650 mg total) every 6 (six) hours as needed by mouth for mild pain, moderate pain, fever or headache (or Fever >/= 101).    doxycycline (VIBRAMYCIN) 100 MG capsule Take 1 capsule (100 mg total) 2 (two) times daily for 14 days by mouth. Qty: 28 capsule, Refills: 0      CONTINUE these medications which have NOT CHANGED   Details  aspirin 81 MG tablet Take 81 mg by mouth daily.    docusate sodium (COLACE) 100 MG capsule Take 100 mg every morning by mouth.  Melatonin 3 MG TABS Take 3 mg at bedtime by mouth.     mirtazapine (REMERON) 7.5 MG tablet Take 7.5 mg at bedtime by mouth.    Ostomy Supplies (SKIN PREP WIPES) MISC Apply 1 application daily topically. Apply to second right toe every day for loss of toenail    saccharomyces boulardii (FLORASTOR) 250 MG capsule Take 250 mg 2 (two) times daily by mouth.      STOP taking these medications     clindamycin (CLEOCIN) 300 MG capsule      ibuprofen (ADVIL,MOTRIN) 200 MG tablet      lisinopril (PRINIVIL,ZESTRIL) 2.5 MG tablet      tuberculin (TUBERSOL) 5 UNIT/0.1ML injection         Procedures/Studies: Dg Chest 2 View  Result Date: 01/09/2017 CLINICAL DATA:  Syncope. EXAM: CHEST  2 VIEW COMPARISON:  09/28/2015. FINDINGS: The cardiomediastinal silhouette is within normal limits. The lungs are slightly less well inflated than on the prior study with minimal bibasilar atelectasis. No lobar consolidation, edema, pleural effusion, or pneumothorax is identified. Advanced degenerative changes are noted at the shoulders. Aortic atherosclerosis is noted. IMPRESSION: No active cardiopulmonary disease. Electronically Signed   By: Sebastian Ache M.D.   On: 01/09/2017  16:40   Ct Head Wo Contrast  Result Date: 01/09/2017 CLINICAL DATA:  Dementia, unresponsive. EXAM: CT HEAD WITHOUT CONTRAST TECHNIQUE: Contiguous axial images were obtained from the base of the skull through the vertex without intravenous contrast. COMPARISON:  None. FINDINGS: BRAIN: There is sulcal and ventricular prominence consistent with superficial and central atrophy. No intraparenchymal hemorrhage, mass effect nor midline shift. Periventricular and subcortical white matter hypodensities consistent with chronic small vessel ischemic disease are identified. No acute large vascular territory infarcts. No abnormal extra-axial fluid collections. Basal cisterns are not effaced and midline. VASCULAR: Moderate calcific atherosclerosis of the carotid siphons. SKULL: No skull fracture. No significant scalp soft tissue swelling. SINUSES/ORBITS: The mastoid air-cells are clear. The included paranasal sinuses are well-aerated.The included ocular globes and orbital contents are non-suspicious. OTHER: None. IMPRESSION: Atrophy with chronic appearing small vessel ischemia. No acute intracranial abnormality. Electronically Signed   By: Tollie Eth M.D.   On: 01/09/2017 16:44   Dg Toe 2nd Right  Result Date: 01/20/2017 CLINICAL DATA:  RIGHT SECOND TOE BLACK NECROSIS EXAM: RIGHT SECOND TOE COMPARISON:  None. FINDINGS: Subtle irregularity of the cortex of the tuft of the second distal phalanx. Remainder of the visualized osseous structures appear intact and normal in mineralization. Overlying soft tissues are slightly irregular, compatible with the given history of necrosis. No soft tissue gas seen. IMPRESSION: 1. Subtle irregularity of the cortex at the tuft of the second distal phalanx, with indistinct margins suspicious for early destructive changes of osteomyelitis. 2. Subtle irregularity of the overlying soft tissues compatible with the given history of necrosis. No soft tissue gas seen. Electronically Signed   By:  Bary Richard M.D.   On: 01/20/2017 16:01     Subjective: Pt demented.   Discharge Exam: Vitals:   01/20/17 2124 01/21/17 0539  BP: 129/79 128/73  Pulse: 92 90  Resp: 20 18  Temp: 98.2 F (36.8 C) 98.2 F (36.8 C)  SpO2: 100% 100%   Vitals:   01/20/17 1824 01/20/17 2000 01/20/17 2124 01/21/17 0539  BP: (!) 175/74  129/79 128/73  Pulse: 89  92 90  Resp: 18  20 18   Temp: 99.1 F (37.3 C)  98.2 F (36.8 C) 98.2 F (36.8 C)  TempSrc: Oral  Oral  Oral  SpO2: 100%  100% 100%  Weight: 53.4 kg (117 lb 11.6 oz)     Height:  5' 6.93" (1.7 m)     General: Pt is alert, awake, not in acute distress, severe dementia.  Cardiovascular: normal S1/S2 +, no rubs, no gallops Respiratory: CTA bilaterally, no wheezing, no rhonchi Abdominal: Soft, NT, ND, bowel sounds + Extremities: dry gangrene right 2nd toe with minimal cellulitis seen.  No purulence seen.  Pedal pulses palpated bilateral and feet are warm.     The results of significant diagnostics from this hospitalization (including imaging, microbiology, ancillary and laboratory) are listed below for reference.     Microbiology: Recent Results (from the past 240 hour(s))  MRSA PCR Screening     Status: None   Collection Time: 01/20/17  6:57 PM  Result Value Ref Range Status   MRSA by PCR NEGATIVE NEGATIVE Final    Comment:        The GeneXpert MRSA Assay (FDA approved for NASAL specimens only), is one component of a comprehensive MRSA colonization surveillance program. It is not intended to diagnose MRSA infection nor to guide or monitor treatment for MRSA infections.   Blood Cultures x 2 sites     Status: None (Preliminary result)   Collection Time: 01/20/17  8:00 PM  Result Value Ref Range Status   Specimen Description BLOOD RIGHT ARM  Final   Special Requests   Final    BOTTLES DRAWN AEROBIC AND ANAEROBIC Blood Culture adequate volume   Culture NO GROWTH < 12 HOURS  Final   Report Status PENDING  Incomplete  Blood  Cultures x 2 sites     Status: None (Preliminary result)   Collection Time: 01/20/17  8:10 PM  Result Value Ref Range Status   Specimen Description BLOOD LEFT ARM  Final   Special Requests   Final    BOTTLES DRAWN AEROBIC AND ANAEROBIC Blood Culture adequate volume   Culture NO GROWTH < 12 HOURS  Final   Report Status PENDING  Incomplete     Labs: BNP (last 3 results) No results for input(s): BNP in the last 8760 hours. Basic Metabolic Panel: Recent Labs  Lab 01/20/17 1544 01/21/17 0532  NA 140 139  K 4.6 4.2  CL 112* 110  CO2 21* 19*  GLUCOSE 121* 105*  BUN 43* 36*  CREATININE 1.58* 1.40*  CALCIUM 8.9 8.7*   Liver Function Tests: No results for input(s): AST, ALT, ALKPHOS, BILITOT, PROT, ALBUMIN in the last 168 hours. No results for input(s): LIPASE, AMYLASE in the last 168 hours. No results for input(s): AMMONIA in the last 168 hours. CBC: Recent Labs  Lab 01/20/17 1544 01/21/17 0532  WBC 8.3 11.0*  NEUTROABS 5.2  --   HGB 10.3* 10.7*  HCT 32.5* 33.9*  MCV 91.8 90.6  PLT 388 412*   Cardiac Enzymes: No results for input(s): CKTOTAL, CKMB, CKMBINDEX, TROPONINI in the last 168 hours. BNP: Invalid input(s): POCBNP CBG: No results for input(s): GLUCAP in the last 168 hours. D-Dimer No results for input(s): DDIMER in the last 72 hours. Hgb A1c No results for input(s): HGBA1C in the last 72 hours. Lipid Profile No results for input(s): CHOL, HDL, LDLCALC, TRIG, CHOLHDL, LDLDIRECT in the last 72 hours. Thyroid function studies No results for input(s): TSH, T4TOTAL, T3FREE, THYROIDAB in the last 72 hours.  Invalid input(s): FREET3 Anemia work up No results for input(s): VITAMINB12, FOLATE, FERRITIN, TIBC, IRON, RETICCTPCT in the last 72 hours. Urinalysis  Component Value Date/Time   COLORURINE YELLOW 01/09/2017 1815   APPEARANCEUR CLEAR 01/09/2017 1815   LABSPEC 1.024 01/09/2017 1815   PHURINE 5.0 01/09/2017 1815   GLUCOSEU NEGATIVE 01/09/2017 1815    HGBUR NEGATIVE 01/09/2017 1815   BILIRUBINUR NEGATIVE 01/09/2017 1815   KETONESUR 5 (A) 01/09/2017 1815   PROTEINUR NEGATIVE 01/09/2017 1815   UROBILINOGEN 0.2 09/05/2013 1925   NITRITE NEGATIVE 01/09/2017 1815   LEUKOCYTESUR NEGATIVE 01/09/2017 1815   Sepsis Labs Invalid input(s): PROCALCITONIN,  WBC,  LACTICIDVEN Microbiology Recent Results (from the past 240 hour(s))  MRSA PCR Screening     Status: None   Collection Time: 01/20/17  6:57 PM  Result Value Ref Range Status   MRSA by PCR NEGATIVE NEGATIVE Final    Comment:        The GeneXpert MRSA Assay (FDA approved for NASAL specimens only), is one component of a comprehensive MRSA colonization surveillance program. It is not intended to diagnose MRSA infection nor to guide or monitor treatment for MRSA infections.   Blood Cultures x 2 sites     Status: None (Preliminary result)   Collection Time: 01/20/17  8:00 PM  Result Value Ref Range Status   Specimen Description BLOOD RIGHT ARM  Final   Special Requests   Final    BOTTLES DRAWN AEROBIC AND ANAEROBIC Blood Culture adequate volume   Culture NO GROWTH < 12 HOURS  Final   Report Status PENDING  Incomplete  Blood Cultures x 2 sites     Status: None (Preliminary result)   Collection Time: 01/20/17  8:10 PM  Result Value Ref Range Status   Specimen Description BLOOD LEFT ARM  Final   Special Requests   Final    BOTTLES DRAWN AEROBIC AND ANAEROBIC Blood Culture adequate volume   Culture NO GROWTH < 12 HOURS  Final   Report Status PENDING  Incomplete   Time coordinating discharge:   SIGNED:  Standley Dakins, MD  [facility] 01/21/2017, 10:55 AM Pager [phone]  If 7PM-7AM, please contact night-coverage [email or web address] Password [email or web address]

## 2017-01-21 NOTE — Consult Note (Signed)
WOC Nurse wound consult note Reason for Consult:Cellulitis to right lower leg and dry gangene to right second metatarsal.  Surgery has consulted and no intervention is appropriate at this time.  Wound type:vascular Pressure Injury POA: NA Measurement: Black cool toe on right foot (second toe).  Erythema extends from this toe to anterior lower leg Wound XBJ:YNWGNFAObed:necrotic Drainage (amount, consistency, odor) dry  None  Necrotic odor Periwound:erythema Dressing procedure/placement/frequency: Paint right second necrotic toe daily with betadine.  Will not follow at this time.  Please re-consult if needed.  Maple HudsonKaren Airiel Oblinger RN BSN CWON Pager [phone]

## 2017-01-21 NOTE — Care Management Obs Status (Signed)
MEDICARE OBSERVATION STATUS NOTIFICATION   Patient Details  Name: Frank Fleming MRN: [id number] Date of Birth: 07/05/1923   Medicare Observation Status Notification Given:  Yes    Frank Fleming, Frank Beaulac Demske, RN 01/21/2017, 12:23 PM

## 2017-01-21 NOTE — Progress Notes (Signed)
Called Dr. Robb Matarrtiz about lab results of Gram + Cocci anaerobic bottle, no new orders placed pt is still to be discharged. Awaiting EMS for transport.

## 2017-01-21 NOTE — Care Management Note (Signed)
Case Management Note  Patient Details  Name: Frank Fleming MRN: [id number] Date of Birth: 04/25/1923  Subjective/Objective:                 Admitted with cellulitis[facility].   Action/Plan: [facility]. CM contacted pt son Frank Spare(Marcus) to deliver code 1444. He is aware pt will be discharging today and that CSW will be in contact.   Expected Discharge Date:  01/21/17               Expected Discharge Plan:  Skilled Nursing Facility  In-House Referral:  Clinical Social Work  Discharge planning Services  NA  Post Acute Care Choice:  NA Choice offered to:  NA  Status of Service:  Completed, signed off  Malcolm MetroChildress, Shandee Jergens Demske, RN 01/21/2017, 12:14 PM

## 2017-01-21 NOTE — Progress Notes (Signed)
Initial Nutrition Assessment   INTERVENTION:  Magic cup with lunch and dinner   Hormel shake each meal  MVI daily  NUTRITION DIAGNOSIS:   Increased nutrient needs related to wound healing(dry gangrene) as evidenced by estimated needs, percent weight loss (11%) x 6 months.   GOAL:  Patient will meet greater than or equal to 90% of their needs(if feasible given poor appetite, dementia and dysphagia)   MONITOR:   PO intake  REASON FOR ASSESSMENT:   Consult Wound healing  ASSESSMENT:  Frank Fleming is a 83 yo male with dry gangrene to toe. No active drainage or need for surgcial intervention at this time per MD assessment. His hx includes CAD, PVD, Dysphagia, Dementia and depression as well as constipation.  Diet is mechanically altered:pureed foods and nectar thick beverages. Po intake if very poor 10% per documentation. He is taking remeron for appetite. The patient is unable to provide nutrition hx at this time- no family at bedside.  His weight hx show loss of 11% since May (6 months) which is significant for timeframe. Suspect this pt has moderate malnutrition given his unplanned wt loss and mild-moderate muscle loss.  Recent Labs  Lab 01/20/17 1544 01/21/17 0532  NA 140 139  K 4.6 4.2  CL 112* 110  CO2 21* 19*  BUN 43* 36*  CREATININE 1.58* 1.40*  CALCIUM 8.9 8.7*  GLUCOSE 121* 105*   Labs: BUN 36 and Cr 1.4 trending down.   Meds: antibiotics and remeron for appetite and a stool softener- Colace.  NUTRITION - FOCUSED PHYSICAL EXAM:    Most Recent Value  Orbital Region  No depletion  Upper Arm Region  Mild depletion  Thoracic and Lumbar Region  Unable to assess  Buccal Region  No depletion  Temple Region  Mild depletion  Clavicle Bone Region  Moderate depletion  Clavicle and Acromion Bone Region  Mild depletion  Scapular Bone Region  Unable to assess  Hair  Reviewed  Eyes  Reviewed  Mouth  Unable to assess  Skin  Reviewed  Nails  Reviewed      Diet  Order:  DIET - DYS 1 Room service appropriate? Yes; Fluid consistency: Nectar Thick Diet - low sodium heart healthy  EDUCATION NEEDS:  Not appropriate for education at this time(impaired cognitive status)  Skin:  Skin Assessment: Reviewed RN Assessment  Last BM:  unknown  Height:   Ht Readings from Last 1 Encounters:  01/20/17 5' 6.93" (1.7 m)    Weight:   Wt Readings from Last 1 Encounters:  01/20/17 117 lb 11.6 oz (53.4 kg)    Ideal Body Weight:  67 kg  BMI:  Body mass index is 18.48 kg/m.  Estimated Nutritional Needs:   Kcal:  1590-1855 (30-35 kcal/kg)  Protein:  64-74 gr  Fluid:  >1500 ml daily   Royann ShiversLynn Bransen [place] MS,RD,CSG,LDN Office: [phone] Pager: [phone]

## 2017-01-21 NOTE — Care Management CC44 (Signed)
Condition Code 44 Documentation Completed  Patient Details  Name: Frank Fleming MRN: [id number] Date of Birth: 10/17/1923   Condition Code 44 given:  Yes Patient signature on Condition Code 44 notice:  Yes Documentation of 2 MD's agreement:  Yes Code 44 added to claim:  Yes    Malcolm Metrohildress, Frank Fleming Demske, RN 01/21/2017, 12:23 PM

## 2017-01-21 NOTE — Consult Note (Signed)
Reason for Consult: Right second toe cellulitis Referring Physician: Dr. Dayton Bailiff Frank Fleming is an 83 y.o. male.  HPI: Patient is a 83 year old demented white male who was transferred from a skilled nursing unit for evaluation and treatment of cellulitis of the right second toe.  History is limited due to the patient's severe dementia.  I did review the history and physical examination by Dr. Lorin Mercy.  Past Medical History:  Diagnosis Date  . Arthritis   . Benign prostatic hyperplasia   . CAD (coronary artery disease)    Stent PCI RCA occlusion 2003.  Cypher  . Cognitive communication deficit   . Colon polyp   . Constipation   . Dementia   . Depression   . Diverticulosis of colon   . Dizziness and giddiness 10/05/2013  . Dysphagia   . Hypercholesterolemia   . Hyperlipidemia   . Hypertension   . Insomnia   . Knee pain   . Muscle weakness (generalized)   . Peripheral vascular disease ([place])   . Pneumonia     Past Surgical History:  Procedure Laterality Date  . ANGIOPLASTY    . APPENDECTOMY    . ENDARTERECTOMY FEMORAL    . HERNIA REPAIR     right inguinal  . TONSILLECTOMY      Family History  Problem Relation Age of Onset  . Leukemia Sister     Social History:  reports that  has never smoked. he has never used smokeless tobacco. He reports that he does not drink alcohol or use drugs.  Allergies:  Allergies  Allergen Reactions  . Peanut-Containing Drug Products     UNKNOWN-LISTED PER NURSING FACILITY  . Penicillins     Unknown-to patient and family   . Penicillins Other (See Comments)    Has patient had a PCN reaction causing immediate rash, facial/tongue/throat swelling, SOB or lightheadedness with hypotension: Unknown Has patient had a PCN reaction causing severe rash involving mucus membranes or skin necrosis: Unknown Has patient had a PCN reaction that required hospitalization: Unknown Has patient had a PCN reaction occurring within the last 10 years:  Unknown If all of the above answers are "NO", then may proceed with Cephalosporin use.   . Zocor [Simvastatin]     unknown  . Zocor [Simvastatin]     UNKNOWN-LISTED PER NURSING FACILITY    Medications: I have reviewed the patient's current medications.  Results for orders placed or performed during the hospital encounter of 01/20/17 (from the past 48 hour(s))  CBC with Differential     Status: Abnormal   Collection Time: 01/20/17  3:44 PM  Result Value Ref Range   WBC 8.3 4.0 - 10.5 K/uL   RBC 3.54 (L) 4.22 - 5.81 MIL/uL   Hemoglobin 10.3 (L) 13.0 - 17.0 g/dL   HCT 32.5 (L) 39.0 - 52.0 %   MCV 91.8 78.0 - 100.0 fL   MCH 29.1 26.0 - 34.0 pg   MCHC 31.7 30.0 - 36.0 g/dL   RDW 14.0 11.5 - 15.5 %   Platelets 388 150 - 400 K/uL   Neutrophils Relative % 64 %   Neutro Abs 5.2 1.7 - 7.7 K/uL   Lymphocytes Relative 22 %   Lymphs Abs 1.8 0.7 - 4.0 K/uL   Monocytes Relative 11 %   Monocytes Absolute 0.9 0.1 - 1.0 K/uL   Eosinophils Relative 3 %   Eosinophils Absolute 0.2 0.0 - 0.7 K/uL   Basophils Relative 0 %   Basophils Absolute 0.0 0.0 -  0.1 K/uL  Basic metabolic panel     Status: Abnormal   Collection Time: 01/20/17  3:44 PM  Result Value Ref Range   Sodium 140 135 - 145 mmol/L   Potassium 4.6 3.5 - 5.1 mmol/L   Chloride 112 (H) 101 - 111 mmol/L   CO2 21 (L) 22 - 32 mmol/L   Glucose, Bld 121 (H) 65 - 99 mg/dL   BUN 43 (H) 6 - 20 mg/dL   Creatinine, Ser 1.58 (H) 0.61 - 1.24 mg/dL   Calcium 8.9 8.9 - 10.3 mg/dL   GFR calc non Af Amer 37 (L) >60 mL/min   GFR calc Af Amer 42 (L) >60 mL/min    Comment: (NOTE) The eGFR has been calculated using the CKD EPI equation. This calculation has not been validated in all clinical situations. eGFR's persistently <60 mL/min signify possible Chronic Kidney Disease.    Anion gap 7 5 - 15  MRSA PCR Screening     Status: None   Collection Time: 01/20/17  6:57 PM  Result Value Ref Range   MRSA by PCR NEGATIVE NEGATIVE    Comment:         The GeneXpert MRSA Assay (FDA approved for NASAL specimens only), is one component of a comprehensive MRSA colonization surveillance program. It is not intended to diagnose MRSA infection nor to guide or monitor treatment for MRSA infections.   Sedimentation rate     Status: Abnormal   Collection Time: 01/20/17  8:00 PM  Result Value Ref Range   Sed Rate 45 (H) 0 - 16 mm/hr  Blood Cultures x 2 sites     Status: None (Preliminary result)   Collection Time: 01/20/17  8:00 PM  Result Value Ref Range   Specimen Description BLOOD RIGHT ARM    Special Requests      BOTTLES DRAWN AEROBIC AND ANAEROBIC Blood Culture adequate volume   Culture NO GROWTH < 12 HOURS    Report Status PENDING   Blood Cultures x 2 sites     Status: None (Preliminary result)   Collection Time: 01/20/17  8:10 PM  Result Value Ref Range   Specimen Description BLOOD LEFT ARM    Special Requests      BOTTLES DRAWN AEROBIC AND ANAEROBIC Blood Culture adequate volume   Culture NO GROWTH < 12 HOURS    Report Status PENDING   Basic metabolic panel     Status: Abnormal   Collection Time: 01/21/17  5:32 AM  Result Value Ref Range   Sodium 139 135 - 145 mmol/L   Potassium 4.2 3.5 - 5.1 mmol/L   Chloride 110 101 - 111 mmol/L   CO2 19 (L) 22 - 32 mmol/L   Glucose, Bld 105 (H) 65 - 99 mg/dL   BUN 36 (H) 6 - 20 mg/dL   Creatinine, Ser 1.40 (H) 0.61 - 1.24 mg/dL   Calcium 8.7 (L) 8.9 - 10.3 mg/dL   GFR calc non Af Amer 42 (L) >60 mL/min   GFR calc Af Amer 49 (L) >60 mL/min    Comment: (NOTE) The eGFR has been calculated using the CKD EPI equation. This calculation has not been validated in all clinical situations. eGFR's persistently <60 mL/min signify possible Chronic Kidney Disease.    Anion gap 10 5 - 15  CBC     Status: Abnormal   Collection Time: 01/21/17  5:32 AM  Result Value Ref Range   WBC 11.0 (H) 4.0 - 10.5 K/uL   RBC 3.74 (  L) 4.22 - 5.81 MIL/uL   Hemoglobin 10.7 (L) 13.0 - 17.0 g/dL   HCT 33.9  (L) 39.0 - 52.0 %   MCV 90.6 78.0 - 100.0 fL   MCH 28.6 26.0 - 34.0 pg   MCHC 31.6 30.0 - 36.0 g/dL   RDW 13.8 11.5 - 15.5 %   Platelets 412 (H) 150 - 400 K/uL    Dg Toe 2nd Right  Result Date: 01/20/2017 CLINICAL DATA:  RIGHT SECOND TOE BLACK NECROSIS EXAM: RIGHT SECOND TOE COMPARISON:  None. FINDINGS: Subtle irregularity of the cortex of the tuft of the second distal phalanx. Remainder of the visualized osseous structures appear intact and normal in mineralization. Overlying soft tissues are slightly irregular, compatible with the given history of necrosis. No soft tissue gas seen. IMPRESSION: 1. Subtle irregularity of the cortex at the tuft of the second distal phalanx, with indistinct margins suspicious for early destructive changes of osteomyelitis. 2. Subtle irregularity of the overlying soft tissues compatible with the given history of necrosis. No soft tissue gas seen. Electronically Signed   By: Franki Cabot M.D.   On: 01/20/2017 16:01    ROS:  Review of systems not obtained due to patient factors.  Blood pressure 128/73, pulse 90, temperature 98.2 F (36.8 C), temperature source Oral, resp. rate 18, height 5' 6.93" (1.7 m), weight 117 lb 11.6 oz (53.4 kg), SpO2 100 %. Physical Exam: White male with advanced dementia. Lungs clear to auscultation with equal breath sounds bilaterally Head is normocephalic, atraumatic Heart examination reveals regular rate and rhythm without S3, S4, murmurs Extremity examination was difficult as patient was somewhat contracted and moving a lot in the bed.  I was able to palpate a femoral pulse on the right side.  Difficult to palpate pedal pulses.  The right second toe has dry gangrene present on the distal half without purulent drainage.  Mild erythema at the base of the right second toe is noted. No documented segmental Doppler studies on epic.  Assessment/Plan: Impression: Dry gangrene of right second toe with minimal cellulitis.  Probable  peripheral vascular disease. Plan: No need for acute surgical intervention at this time as there appears to be no active purulent drainage present.  He is not a revascularization candidate even if workup was performed.  I would just treat him with a 2-week course of oral antibiotics.  Will follow peripherally with you.  Aviva Signs 01/21/2017, 8:40 AM

## 2017-01-21 NOTE — Progress Notes (Signed)
1900: Microbiology: Gram +Cocci Anaerobic bottle: Dr. Laural BenesJohnson notified.  No new orders. Currently receiving Doxycycline and awaiting EMS for transport

## 2017-01-22 DIAGNOSIS — I739 Peripheral vascular disease, unspecified: Secondary | ICD-10-CM | POA: Diagnosis not present

## 2017-01-22 DIAGNOSIS — N183 Chronic kidney disease, stage 3 (moderate): Secondary | ICD-10-CM | POA: Diagnosis not present

## 2017-01-22 DIAGNOSIS — R1312 Dysphagia, oropharyngeal phase: Secondary | ICD-10-CM | POA: Diagnosis not present

## 2017-01-22 DIAGNOSIS — M86171 Other acute osteomyelitis, right ankle and foot: Secondary | ICD-10-CM | POA: Diagnosis not present

## 2017-01-22 DIAGNOSIS — I119 Hypertensive heart disease without heart failure: Secondary | ICD-10-CM | POA: Diagnosis not present

## 2017-01-22 NOTE — Progress Notes (Signed)
[facility], pt was stable upon discharge. Pt IV was removed before discharge, all questions and concerns answered.

## 2017-01-23 DIAGNOSIS — R1312 Dysphagia, oropharyngeal phase: Secondary | ICD-10-CM | POA: Diagnosis not present

## 2017-01-23 DIAGNOSIS — M86171 Other acute osteomyelitis, right ankle and foot: Secondary | ICD-10-CM | POA: Diagnosis not present

## 2017-01-23 LAB — CULTURE, BLOOD (ROUTINE X 2): SPECIAL REQUESTS: ADEQUATE

## 2017-01-24 DIAGNOSIS — R1312 Dysphagia, oropharyngeal phase: Secondary | ICD-10-CM | POA: Diagnosis not present

## 2017-01-24 DIAGNOSIS — M86171 Other acute osteomyelitis, right ankle and foot: Secondary | ICD-10-CM | POA: Diagnosis not present

## 2017-01-25 DIAGNOSIS — R1312 Dysphagia, oropharyngeal phase: Secondary | ICD-10-CM | POA: Diagnosis not present

## 2017-01-25 DIAGNOSIS — M86171 Other acute osteomyelitis, right ankle and foot: Secondary | ICD-10-CM | POA: Diagnosis not present

## 2017-01-25 LAB — CULTURE, BLOOD (ROUTINE X 2)
Culture: NO GROWTH
Special Requests: ADEQUATE

## 2017-01-26 DIAGNOSIS — M86171 Other acute osteomyelitis, right ankle and foot: Secondary | ICD-10-CM | POA: Diagnosis not present

## 2017-01-26 DIAGNOSIS — R1312 Dysphagia, oropharyngeal phase: Secondary | ICD-10-CM | POA: Diagnosis not present

## 2017-01-28 DIAGNOSIS — R1312 Dysphagia, oropharyngeal phase: Secondary | ICD-10-CM | POA: Diagnosis not present

## 2017-01-28 DIAGNOSIS — M86171 Other acute osteomyelitis, right ankle and foot: Secondary | ICD-10-CM | POA: Diagnosis not present

## 2017-01-29 DIAGNOSIS — M86171 Other acute osteomyelitis, right ankle and foot: Secondary | ICD-10-CM | POA: Diagnosis not present

## 2017-01-29 DIAGNOSIS — R1312 Dysphagia, oropharyngeal phase: Secondary | ICD-10-CM | POA: Diagnosis not present

## 2017-01-30 DIAGNOSIS — M86171 Other acute osteomyelitis, right ankle and foot: Secondary | ICD-10-CM | POA: Diagnosis not present

## 2017-01-30 DIAGNOSIS — R1312 Dysphagia, oropharyngeal phase: Secondary | ICD-10-CM | POA: Diagnosis not present

## 2017-01-31 DIAGNOSIS — R1312 Dysphagia, oropharyngeal phase: Secondary | ICD-10-CM | POA: Diagnosis not present

## 2017-01-31 DIAGNOSIS — R627 Adult failure to thrive: Secondary | ICD-10-CM | POA: Diagnosis not present

## 2017-01-31 DIAGNOSIS — I739 Peripheral vascular disease, unspecified: Secondary | ICD-10-CM | POA: Diagnosis not present

## 2017-01-31 DIAGNOSIS — I119 Hypertensive heart disease without heart failure: Secondary | ICD-10-CM | POA: Diagnosis not present

## 2017-01-31 DIAGNOSIS — M86171 Other acute osteomyelitis, right ankle and foot: Secondary | ICD-10-CM | POA: Diagnosis not present

## 2017-02-01 DIAGNOSIS — R1312 Dysphagia, oropharyngeal phase: Secondary | ICD-10-CM | POA: Diagnosis not present

## 2017-02-01 DIAGNOSIS — M86171 Other acute osteomyelitis, right ankle and foot: Secondary | ICD-10-CM | POA: Diagnosis not present

## 2017-02-04 DIAGNOSIS — R1312 Dysphagia, oropharyngeal phase: Secondary | ICD-10-CM | POA: Diagnosis not present

## 2017-02-05 DIAGNOSIS — R1312 Dysphagia, oropharyngeal phase: Secondary | ICD-10-CM | POA: Diagnosis not present

## 2017-02-06 DIAGNOSIS — R1312 Dysphagia, oropharyngeal phase: Secondary | ICD-10-CM | POA: Diagnosis not present

## 2017-02-07 DIAGNOSIS — R627 Adult failure to thrive: Secondary | ICD-10-CM | POA: Diagnosis not present

## 2017-02-07 DIAGNOSIS — R1312 Dysphagia, oropharyngeal phase: Secondary | ICD-10-CM | POA: Diagnosis not present

## 2017-02-07 DIAGNOSIS — I739 Peripheral vascular disease, unspecified: Secondary | ICD-10-CM | POA: Diagnosis not present

## 2017-02-08 DIAGNOSIS — R1312 Dysphagia, oropharyngeal phase: Secondary | ICD-10-CM | POA: Diagnosis not present

## 2017-03-05 DEATH — deceased
